# Patient Record
Sex: Male | Born: 1935 | Race: White | Hispanic: No | Marital: Married | Smoking: Never smoker
Health system: Southern US, Community
[De-identification: ages and names within clinical notes are randomized; demographics above are authoritative.]

## PROBLEM LIST (undated history)

## (undated) DIAGNOSIS — Z7901 Long term (current) use of anticoagulants: Secondary | ICD-10-CM

## (undated) DIAGNOSIS — I2510 Atherosclerotic heart disease of native coronary artery without angina pectoris: Secondary | ICD-10-CM

## (undated) DIAGNOSIS — E785 Hyperlipidemia, unspecified: Secondary | ICD-10-CM

## (undated) DIAGNOSIS — I10 Essential (primary) hypertension: Secondary | ICD-10-CM

## (undated) DIAGNOSIS — I4892 Unspecified atrial flutter: Secondary | ICD-10-CM

## (undated) HISTORY — DX: Atherosclerotic heart disease of native coronary artery without angina pectoris: I25.10

## (undated) HISTORY — DX: Unspecified atrial flutter: I48.92

## (undated) HISTORY — DX: Long term (current) use of anticoagulants: Z79.01

## (undated) HISTORY — DX: Essential (primary) hypertension: I10

## (undated) HISTORY — DX: Hyperlipidemia, unspecified: E78.5

---

## 2004-06-07 ENCOUNTER — Ambulatory Visit: Payer: Self-pay | Admitting: Internal Medicine

## 2004-06-07 ENCOUNTER — Ambulatory Visit (HOSPITAL_COMMUNITY): Admission: RE | Admit: 2004-06-07 | Discharge: 2004-06-07 | Payer: Self-pay | Admitting: Internal Medicine

## 2007-05-02 HISTORY — PX: CORONARY ARTERY BYPASS GRAFT: SHX141

## 2007-05-07 ENCOUNTER — Inpatient Hospital Stay (HOSPITAL_COMMUNITY): Admission: RE | Admit: 2007-05-07 | Discharge: 2007-05-12 | Payer: Self-pay | Admitting: Cardiovascular Disease

## 2007-05-07 ENCOUNTER — Ambulatory Visit: Payer: Self-pay | Admitting: Cardiovascular Disease

## 2007-05-07 ENCOUNTER — Encounter: Payer: Self-pay | Admitting: Thoracic Surgery (Cardiothoracic Vascular Surgery)

## 2007-05-07 ENCOUNTER — Ambulatory Visit: Payer: Self-pay | Admitting: Thoracic Surgery (Cardiothoracic Vascular Surgery)

## 2007-06-01 ENCOUNTER — Ambulatory Visit: Payer: Self-pay | Admitting: Thoracic Surgery (Cardiothoracic Vascular Surgery)

## 2007-06-01 ENCOUNTER — Encounter
Admission: RE | Admit: 2007-06-01 | Discharge: 2007-06-01 | Payer: Self-pay | Admitting: Thoracic Surgery (Cardiothoracic Vascular Surgery)

## 2007-06-03 ENCOUNTER — Ambulatory Visit: Payer: Self-pay | Admitting: Cardiovascular Disease

## 2007-06-04 ENCOUNTER — Encounter (HOSPITAL_COMMUNITY): Admission: RE | Admit: 2007-06-04 | Discharge: 2007-07-01 | Payer: Self-pay | Admitting: Cardiovascular Disease

## 2007-07-03 ENCOUNTER — Encounter (HOSPITAL_COMMUNITY): Admission: RE | Admit: 2007-07-03 | Discharge: 2007-08-02 | Payer: Self-pay | Admitting: Cardiovascular Disease

## 2007-08-03 ENCOUNTER — Encounter (HOSPITAL_COMMUNITY): Admission: RE | Admit: 2007-08-03 | Discharge: 2007-09-02 | Payer: Self-pay | Admitting: Cardiovascular Disease

## 2007-08-19 ENCOUNTER — Ambulatory Visit: Payer: Self-pay | Admitting: Cardiovascular Disease

## 2007-09-03 ENCOUNTER — Encounter (HOSPITAL_COMMUNITY): Admission: RE | Admit: 2007-09-03 | Discharge: 2007-10-03 | Payer: Self-pay | Admitting: Cardiovascular Disease

## 2008-02-25 ENCOUNTER — Ambulatory Visit: Payer: Self-pay | Admitting: Cardiology

## 2009-03-02 ENCOUNTER — Encounter: Payer: Self-pay | Admitting: Cardiovascular Disease

## 2009-05-22 ENCOUNTER — Encounter (INDEPENDENT_AMBULATORY_CARE_PROVIDER_SITE_OTHER): Payer: Self-pay | Admitting: *Deleted

## 2009-05-22 LAB — CONVERTED CEMR LAB
AST: 21 units/L
Alkaline Phosphatase: 71 units/L
BUN: 15 mg/dL
CO2: 27 meq/L
Chloride: 106 meq/L
Cholesterol: 139 mg/dL
Hgb A1c MFr Bld: 6.9 %
Potassium: 4.4 meq/L
Sodium: 142 meq/L
Triglycerides: 65 mg/dL

## 2009-06-05 ENCOUNTER — Telehealth (INDEPENDENT_AMBULATORY_CARE_PROVIDER_SITE_OTHER): Payer: Self-pay

## 2009-06-05 ENCOUNTER — Ambulatory Visit: Payer: Self-pay | Admitting: Cardiovascular Disease

## 2009-06-05 ENCOUNTER — Ambulatory Visit (HOSPITAL_COMMUNITY): Admission: RE | Admit: 2009-06-05 | Discharge: 2009-06-05 | Payer: Self-pay | Admitting: Internal Medicine

## 2009-06-05 ENCOUNTER — Encounter (INDEPENDENT_AMBULATORY_CARE_PROVIDER_SITE_OTHER): Payer: Self-pay | Admitting: Internal Medicine

## 2009-06-09 ENCOUNTER — Encounter (INDEPENDENT_AMBULATORY_CARE_PROVIDER_SITE_OTHER): Payer: Self-pay | Admitting: *Deleted

## 2009-06-13 ENCOUNTER — Ambulatory Visit: Payer: Self-pay | Admitting: Cardiology

## 2009-06-13 ENCOUNTER — Encounter: Payer: Self-pay | Admitting: Cardiology

## 2009-06-13 DIAGNOSIS — I4892 Unspecified atrial flutter: Secondary | ICD-10-CM | POA: Insufficient documentation

## 2009-06-14 ENCOUNTER — Encounter: Payer: Self-pay | Admitting: Cardiology

## 2009-06-19 ENCOUNTER — Encounter (INDEPENDENT_AMBULATORY_CARE_PROVIDER_SITE_OTHER): Payer: Self-pay | Admitting: *Deleted

## 2009-06-21 ENCOUNTER — Ambulatory Visit: Payer: Self-pay | Admitting: Cardiology

## 2009-06-21 LAB — CONVERTED CEMR LAB: POC INR: 2.2

## 2009-07-05 ENCOUNTER — Ambulatory Visit: Payer: Self-pay | Admitting: Cardiology

## 2009-07-05 LAB — CONVERTED CEMR LAB: POC INR: 1.9

## 2009-07-20 ENCOUNTER — Encounter (INDEPENDENT_AMBULATORY_CARE_PROVIDER_SITE_OTHER): Payer: Self-pay | Admitting: *Deleted

## 2009-07-26 ENCOUNTER — Ambulatory Visit: Payer: Self-pay | Admitting: Cardiology

## 2009-08-24 ENCOUNTER — Ambulatory Visit: Payer: Self-pay | Admitting: Cardiology

## 2009-08-24 LAB — CONVERTED CEMR LAB: POC INR: 1.6

## 2009-09-14 ENCOUNTER — Ambulatory Visit: Payer: Self-pay | Admitting: Cardiology

## 2009-09-14 LAB — CONVERTED CEMR LAB: POC INR: 3

## 2009-10-11 ENCOUNTER — Ambulatory Visit: Payer: Self-pay | Admitting: Cardiology

## 2009-10-11 LAB — CONVERTED CEMR LAB: POC INR: 1.8

## 2009-10-27 IMAGING — CR DG CHEST 1V PORT
1 series · 1 of 1 positions shown · non-contrast
Comparison: none

CLINICAL DATA: Chest pain

[view not recorded]
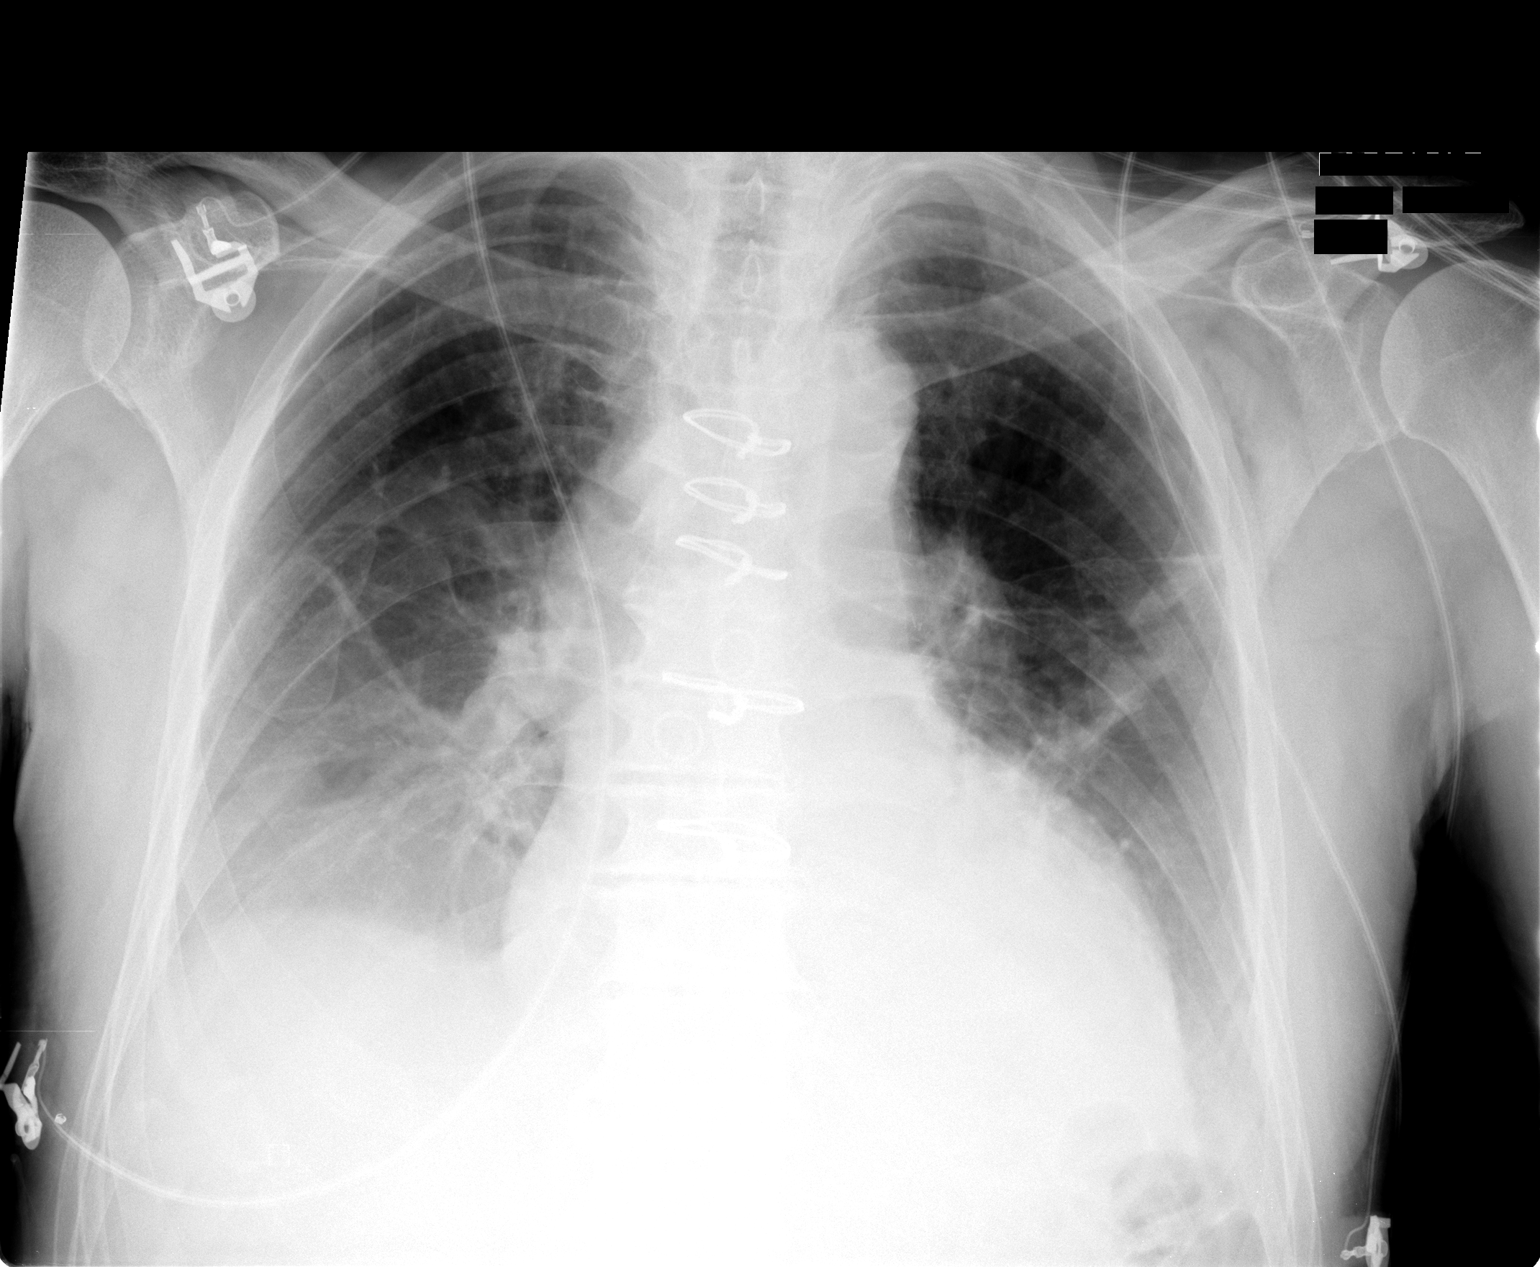

[1 of 1 positions shown; findings below may reference images not displayed]

Portable chest at [DATE]:

Comparison to previous day's portable exam. The left chest tube has been
removed, with no definite pneumothorax. Left IJ Swan-Ganz catheter and
mediastinal drains have been removed. Changes of CABG. Mild enlargement of
cardiac silhouette. Linear scarring along the left chest tube tract. Left
retrocardiac consolidation or atelectasis has increased. There is mild hazy
bibasilar atelectasis or infiltrate, possibly layering effusions.
IMPRESSION: 1. Left chest tube removal without pneumothorax.
2. Worsening left retrocardiac consolidation/atelectasis

## 2009-11-06 ENCOUNTER — Ambulatory Visit: Payer: Self-pay | Admitting: Cardiology

## 2009-12-04 ENCOUNTER — Ambulatory Visit: Payer: Self-pay | Admitting: Cardiology

## 2010-01-04 ENCOUNTER — Ambulatory Visit: Payer: Self-pay | Admitting: Cardiology

## 2010-01-08 ENCOUNTER — Ambulatory Visit: Payer: Self-pay | Admitting: Cardiology

## 2010-01-08 LAB — CONVERTED CEMR LAB: POC INR: 2.3

## 2010-02-08 ENCOUNTER — Ambulatory Visit: Payer: Self-pay | Admitting: Cardiology

## 2010-03-08 ENCOUNTER — Ambulatory Visit: Payer: Self-pay | Admitting: Cardiology

## 2010-03-08 LAB — CONVERTED CEMR LAB: POC INR: 2.3

## 2010-04-05 ENCOUNTER — Ambulatory Visit: Payer: Self-pay | Admitting: Cardiology

## 2010-04-05 LAB — CONVERTED CEMR LAB: POC INR: 2.7

## 2010-05-03 ENCOUNTER — Ambulatory Visit: Payer: Self-pay | Admitting: Cardiology

## 2010-05-31 ENCOUNTER — Ambulatory Visit: Payer: Self-pay | Admitting: Cardiology

## 2010-05-31 LAB — CONVERTED CEMR LAB: POC INR: 2.4

## 2010-06-14 ENCOUNTER — Encounter: Payer: Self-pay | Admitting: Cardiology

## 2010-06-28 ENCOUNTER — Ambulatory Visit: Payer: Self-pay | Admitting: Cardiology

## 2010-06-28 LAB — CONVERTED CEMR LAB: POC INR: 1.7

## 2010-07-26 ENCOUNTER — Ambulatory Visit: Admission: RE | Admit: 2010-07-26 | Discharge: 2010-07-26 | Payer: Self-pay | Source: Home / Self Care

## 2010-07-26 LAB — CONVERTED CEMR LAB: POC INR: 2.2

## 2010-07-31 NOTE — Medication Information (Signed)
Summary: ccr-lr  Anticoagulant Therapy  Managed by: Vashti Hey RN PCP: Osborne Casco Supervising MD: Daleen Squibb MD, Maisie Fus Indication 1: Atrial Flutter Lab Used: [facility] [place] Site: [place] INR POC 1.9  Dietary changes: no    Health status changes: no    Bleeding/hemorrhagic complications: no    Recent/future hospitalizations: no    Any changes in medication regimen? no    Recent/future dental: no  Any missed doses?: no       Is patient compliant with meds? yes       Allergies: No Known Drug Allergies  Anticoagulation Management History:      The patient is taking warfarin and comes in today for a routine follow up visit.  Positive risk factors for bleeding include an age of 75 years or older.  The bleeding index is 'intermediate risk'.  Positive CHADS2 values include Age > 75 years old.  Anticoagulation responsible provider: Daleen Squibb MD, Maisie Fus.  INR POC: 1.9.  Cuvette Lot#: [id number].    Anticoagulation Management Assessment/Plan:      The patient's current anticoagulation dose is Coumadin 5 mg tabs: take as directed.  The target INR is 2.0-3.0.  The next INR is due 07/26/2009.  Anticoagulation instructions were given to patient.  Results were reviewed/authorized by Vashti Hey RN.  He was notified by Vashti Hey RN.         Prior Anticoagulation Instructions: INR 2.2 Pt has been taking coumadin 5mg  alternating with 2.5mg  He takes 5mg  on even days and 2.5mg  on odd days Continue same dose  Current Anticoagulation Instructions: INR 1.9 Increase coumadin to 5mg  once daily except 2.5mg  on Mondays, Wednesdays and Fridays

## 2010-07-31 NOTE — Medication Information (Signed)
Summary: ccr-lr  Anticoagulant Therapy  Managed by: Vashti Hey RN PCP: Dr. Carylon Perches Supervising MD: Dietrich Pates MD, Molly Maduro Indication 1: Atrial Flutter Lab Used: [facility] [place] Site: [place] INR POC 2.0  Dietary changes: no    Health status changes: no    Bleeding/hemorrhagic complications: no    Recent/future hospitalizations: no    Any changes in medication regimen? no    Recent/future dental: no  Any missed doses?: no       Is patient compliant with meds? yes       Allergies: No Known Drug Allergies  Anticoagulation Management History:      The patient is taking warfarin and comes in today for a routine follow up visit.  Positive risk factors for bleeding include an age of 75 years or older.  The bleeding index is 'intermediate risk'.  Positive CHADS2 values include Age > 37 years old.  Anticoagulation responsible provider: Dietrich Pates MD, Molly Maduro.  INR POC: 2.0.  Cuvette Lot#: [id number].    Anticoagulation Management Assessment/Plan:      The patient's current anticoagulation dose is Coumadin 5 mg tabs: take as directed.  The target INR is 2.0-3.0.  The next INR is due 03/08/2010.  Anticoagulation instructions were given to patient.  Results were reviewed/authorized by Vashti Hey RN.  He was notified by Vashti Hey RN.         Prior Anticoagulation Instructions: INR 2.3 Continue coumadin 5mg  once daily   Current Anticoagulation Instructions: INR 2.0 Take coumadin 1 1/2 tablets tonight then resume 1 tablet once daily

## 2010-07-31 NOTE — Medication Information (Signed)
Summary: ccr  Anticoagulant Therapy  Managed by: Vashti Hey RN PCP: Dr. Carylon Perches Supervising MD: Daleen Squibb MD, Maisie Fus Indication 1: Atrial Flutter Lab Used: [facility] [place] Site: [place] INR POC 2.4  Dietary changes: no    Health status changes: no    Bleeding/hemorrhagic complications: no    Recent/future hospitalizations: no    Any changes in medication regimen? no    Recent/future dental: no  Any missed doses?: no       Is patient compliant with meds? yes       Allergies: No Known Drug Allergies  Anticoagulation Management History:      The patient is taking warfarin and comes in today for a routine follow up visit.  Positive risk factors for bleeding include an age of 75 years or older.  The bleeding index is 'intermediate risk'.  Positive CHADS2 values include Age > 44 years old.  Anticoagulation responsible provider: Daleen Squibb MD, Maisie Fus.  INR POC: 2.4.  Cuvette Lot#: [id number].    Anticoagulation Management Assessment/Plan:      The patient's current anticoagulation dose is Coumadin 5 mg tabs: take as directed.  The target INR is 2.0-3.0.  The next INR is due 06/28/2010.  Anticoagulation instructions were given to patient.  Results were reviewed/authorized by Vashti Hey RN.  He was notified by Vashti Hey RN.         Prior Anticoagulation Instructions: INR 1.9 Take coumadin 1 1/2 tablets tonight then resume 1 tablet once daily   Current Anticoagulation Instructions: INR 2.4 Continue coumadin 5mg  once daily

## 2010-07-31 NOTE — Medication Information (Signed)
Summary: ccr-lr  Anticoagulant Therapy  Managed by: Vashti Hey RN PCP: Dr. Carylon Perches Supervising MD: Dietrich Pates MD, Molly Maduro Indication 1: Atrial Flutter Lab Used: [facility] [place] Site: [place] INR POC 2.3  Dietary changes: no    Health status changes: no    Bleeding/hemorrhagic complications: no    Recent/future hospitalizations: no    Any changes in medication regimen? no    Recent/future dental: no  Any missed doses?: no       Is patient compliant with meds? yes       Allergies: No Known Drug Allergies  Anticoagulation Management History:      The patient is taking warfarin and comes in today for a routine follow up visit.  Positive risk factors for bleeding include an age of 75 years or older.  The bleeding index is 'intermediate risk'.  Positive CHADS2 values include Age > 85 years old.  Anticoagulation responsible provider: Dietrich Pates MD, Molly Maduro.  INR POC: 2.3.  Cuvette Lot#: [id number].    Anticoagulation Management Assessment/Plan:      The patient's current anticoagulation dose is Coumadin 5 mg tabs: take as directed.  The target INR is 2.0-3.0.  The next INR is due 02/08/2010.  Anticoagulation instructions were given to patient.  Results were reviewed/authorized by Vashti Hey RN.  He was notified by Vashti Hey RN.         Prior Anticoagulation Instructions: INR 3.0 Continue coumadin 5mg  once daily   Current Anticoagulation Instructions: INR 2.3 Continue coumadin 5mg  once daily

## 2010-07-31 NOTE — Medication Information (Signed)
Summary: ccr-lr  Anticoagulant Therapy  Managed by: Vashti Hey RN PCP: Genevie Cheshire MD: Dietrich Pates MD, Molly Maduro Indication 1: Atrial Flutter Lab Used: [facility] [place] Site: [place] INR POC 1.8  Dietary changes: no    Health status changes: no    Bleeding/hemorrhagic complications: no    Recent/future hospitalizations: no    Any changes in medication regimen? no    Recent/future dental: no  Any missed doses?: no       Is patient compliant with meds? yes       Allergies: No Known Drug Allergies  Anticoagulation Management History:      The patient is taking warfarin and comes in today for a routine follow up visit.  Positive risk factors for bleeding include an age of 75 years or older.  The bleeding index is 'intermediate risk'.  Positive CHADS2 values include Age > 75 years old.  Anticoagulation responsible provider: Dietrich Pates MD, Molly Maduro.  INR POC: 1.8.  Cuvette Lot#: [id number].    Anticoagulation Management Assessment/Plan:      The patient's current anticoagulation dose is Coumadin 5 mg tabs: take as directed.  The target INR is 2.0-3.0.  The next INR is due 11/06/2009.  Anticoagulation instructions were given to patient.  Results were reviewed/authorized by Vashti Hey RN.  He was notified by Vashti Hey RN.         Prior Anticoagulation Instructions: INR 3.0 Take coumadin 2.5mg  tonight then resume 5mg  once daily except 2.5mg  on Wednesdays  Current Anticoagulation Instructions: INR 1.8 Take coumadin 1 1/2 tablets tonight then increase dose to 1 tablet once daily

## 2010-07-31 NOTE — Medication Information (Signed)
Summary: ccr-lr  Anticoagulant Therapy  Managed by: Vashti Hey RN PCP: Genevie Cheshire MD: Dietrich Pates MD, Molly Maduro Indication 1: Atrial Flutter Lab Used: [facility] [place] Site: [place] INR POC 3.0  Dietary changes: no    Health status changes: no    Bleeding/hemorrhagic complications: no    Recent/future hospitalizations: no    Any changes in medication regimen? no    Recent/future dental: no  Any missed doses?: yes     Details: took coumadin 5mg  last night instead of 2.5mg   Is patient compliant with meds? yes       Allergies: No Known Drug Allergies  Anticoagulation Management History:      The patient is taking warfarin and comes in today for a routine follow up visit.  Positive risk factors for bleeding include an age of 75 years or older.  The bleeding index is 'intermediate risk'.  Positive CHADS2 values include Age > 75 years old.  Anticoagulation responsible provider: Dietrich Pates MD, Molly Maduro.  INR POC: 3.0.  Cuvette Lot#: [id number].    Anticoagulation Management Assessment/Plan:      The patient's current anticoagulation dose is Coumadin 5 mg tabs: take as directed.  The target INR is 2.0-3.0.  The next INR is due 10/11/2009.  Anticoagulation instructions were given to patient.  Results were reviewed/authorized by Vashti Hey RN.  He was notified by Vashti Hey RN.         Prior Anticoagulation Instructions: INR 1.6 Take coumadin 1 1/2 tablet tonight then increase dose to 1 tablet once daily except 1/2 tablet on WednesdaysThe patient is to stop coumadin.    Current Anticoagulation Instructions: INR 3.0 Take coumadin 2.5mg  tonight then resume 5mg  once daily except 2.5mg  on Wednesdays

## 2010-07-31 NOTE — Medication Information (Signed)
Summary: ccr-lr  Anticoagulant Therapy  Managed by: Vashti Hey RN PCP: Genevie Cheshire MD: Dietrich Pates MD, Molly Maduro Indication 1: Atrial Flutter Lab Used: [facility] [place] Site: [place] INR POC 3.0  Dietary changes: no    Health status changes: no    Bleeding/hemorrhagic complications: no    Recent/future hospitalizations: no    Any changes in medication regimen? yes       Details: started on Metformin  Recent/future dental: no  Any missed doses?: no       Is patient compliant with meds? yes       Allergies: No Known Drug Allergies  Anticoagulation Management History:      The patient is taking warfarin and comes in today for a routine follow up visit.  Positive risk factors for bleeding include an age of 75 years or older.  The bleeding index is 'intermediate risk'.  Positive CHADS2 values include Age > 62 years old.  Anticoagulation responsible provider: Dietrich Pates MD, Molly Maduro.  INR POC: 3.0.  Cuvette Lot#: [id number].    Anticoagulation Management Assessment/Plan:      The patient's current anticoagulation dose is Coumadin 5 mg tabs: take as directed.  The target INR is 2.0-3.0.  The next INR is due 01/08/2010.  Anticoagulation instructions were given to patient.  Results were reviewed/authorized by Vashti Hey RN.  He was notified by Vashti Hey RN.         Prior Anticoagulation Instructions: INR 3.2 Take coumadin 1/2 tablet tonight then resume 1 tablet once daily   Current Anticoagulation Instructions: INR 3.0 Continue coumadin 5mg  once daily

## 2010-07-31 NOTE — Medication Information (Signed)
Summary: ccr-lr  Anticoagulant Therapy  Managed by: Vashti Hey RN PCP: Dr. Carylon Perches Supervising MD: Dietrich Pates MD, Molly Maduro Indication 1: Atrial Flutter Lab Used: [facility] [place] Site: [place] INR POC 2.3  Dietary changes: no    Health status changes: no    Bleeding/hemorrhagic complications: no    Recent/future hospitalizations: no    Any changes in medication regimen? no    Recent/future dental: no  Any missed doses?: no       Is patient compliant with meds? yes       Allergies: No Known Drug Allergies  Anticoagulation Management History:      The patient is taking warfarin and comes in today for a routine follow up visit.  Positive risk factors for bleeding include an age of 66 years or older.  The bleeding index is 'intermediate risk'.  Positive CHADS2 values include Age > 61 years old.  Anticoagulation responsible provider: Dietrich Pates MD, Molly Maduro.  INR POC: 2.3.  Cuvette Lot#: [id number].    Anticoagulation Management Assessment/Plan:      The patient's current anticoagulation dose is Coumadin 5 mg tabs: take as directed.  The target INR is 2.0-3.0.  The next INR is due 04/05/2010.  Anticoagulation instructions were given to patient.  Results were reviewed/authorized by Vashti Hey RN.  He was notified by Vashti Hey RN.         Prior Anticoagulation Instructions: INR 2.0 Take coumadin 1 1/2 tablets tonight then resume 1 tablet once daily   Current Anticoagulation Instructions: INR 2.3 Continue coumadin 5mg  once daily

## 2010-07-31 NOTE — Medication Information (Signed)
Summary: ccr-lr  Anticoagulant Therapy  Managed by: Vashti Hey RN PCP: Genevie Cheshire MD: Dietrich Pates MD, Molly Maduro Indication 1: Atrial Flutter Lab Used: [facility] [place] Site: [place] INR POC 1.6  Dietary changes: no    Health status changes: no    Bleeding/hemorrhagic complications: no    Recent/future hospitalizations: no    Any changes in medication regimen? no    Recent/future dental: no  Any missed doses?: no       Is patient compliant with meds? yes       Allergies: No Known Drug Allergies  Anticoagulation Management History:      The patient is taking warfarin and comes in today for a routine follow up visit.  Positive risk factors for bleeding include an age of 75 years or older.  The bleeding index is 'intermediate risk'.  Positive CHADS2 values include Age > 75 years old.  Anticoagulation responsible provider: Dietrich Pates MD, Molly Maduro.  INR POC: 1.6.  Cuvette Lot#: [id number].    Anticoagulation Management Assessment/Plan:      The patient's current anticoagulation dose is Coumadin 5 mg tabs: take as directed.  The target INR is 2.0-3.0.  The next INR is due 09/14/2009.  Anticoagulation instructions were given to patient.  Results were reviewed/authorized by Vashti Hey RN.  He was notified by Vashti Hey RN.         Prior Anticoagulation Instructions: INR 2.0 Take coumadin 1 tablet tonight then resume 1 tablet once daily except 1/2 tablet on Mondays, Wednesdays and Fridays  Current Anticoagulation Instructions: INR 1.6 Take coumadin 1 1/2 tablet tonight then increase dose to 1 tablet once daily except 1/2 tablet on WednesdaysThe patient is to stop coumadin.

## 2010-07-31 NOTE — Medication Information (Signed)
Summary: ccr-lr  Anticoagulant Therapy  Managed by: Vashti Hey RN PCP: Dr.Roy Scharlene Corn MD: Diona Browner MD, Remi Deter Indication 1: Atrial Flutter Lab Used: [facility] [place] Site: [place] INR POC 2.0  Dietary changes: no    Health status changes: no    Bleeding/hemorrhagic complications: no    Recent/future hospitalizations: no    Any changes in medication regimen? no    Recent/future dental: no  Any missed doses?: no       Is patient compliant with meds? yes       Allergies: No Known Drug Allergies  Anticoagulation Management History:      The patient is taking warfarin and comes in today for a routine follow up visit.  Positive risk factors for bleeding include an age of 75 years or older.  The bleeding index is 'intermediate risk'.  Positive CHADS2 values include Age > 75 years old.  Anticoagulation responsible provider: Diona Browner MD, Remi Deter.  INR POC: 2.0.  Cuvette Lot#: [id number].    Anticoagulation Management Assessment/Plan:      The patient's current anticoagulation dose is Coumadin 5 mg tabs: take as directed.  The target INR is 2.0-3.0.  The next INR is due 08/24/2009.  Anticoagulation instructions were given to patient.  Results were reviewed/authorized by Vashti Hey RN.  He was notified by Vashti Hey RN.         Prior Anticoagulation Instructions: INR 1.9 Increase coumadin to 5mg  once daily except 2.5mg  on Mondays, Wednesdays and Fridays   Current Anticoagulation Instructions: INR 2.0 Take coumadin 1 tablet tonight then resume 1 tablet once daily except 1/2 tablet on Mondays, Wednesdays and Fridays

## 2010-07-31 NOTE — Medication Information (Signed)
Summary: ccr-lr  Anticoagulant Therapy  Managed by: Vashti Hey RN PCP: Dr. Carylon Perches Supervising MD: Dietrich Pates MD, Molly Maduro Indication 1: Atrial Flutter Lab Used: [facility] [place] Site: [place]  Dietary changes: no    Health status changes: no    Bleeding/hemorrhagic complications: no    Recent/future hospitalizations: no    Any changes in medication regimen? no    Recent/future dental: no  Any missed doses?: yes     Details: Missed 1 dose last Sunday  Is patient compliant with meds? yes       Allergies: No Known Drug Allergies  Anticoagulation Management History:      The patient is taking warfarin and comes in today for a routine follow up visit.  Positive risk factors for bleeding include an age of 75 years or older.  The bleeding index is 'intermediate risk'.  Positive CHADS2 values include Age > 78 years old.  Anticoagulation responsible provider: Dietrich Pates MD, Molly Maduro.  Cuvette Lot#: [id number].    Anticoagulation Management Assessment/Plan:      The patient's current anticoagulation dose is Coumadin 5 mg tabs: take as directed.  The target INR is 2.0-3.0.  The next INR is due 05/31/2010.  Anticoagulation instructions were given to patient.  Results were reviewed/authorized by Vashti Hey RN.  He was notified by Vashti Hey RN.         Prior Anticoagulation Instructions: INR 2.7 Continue coumadin 5mg  once daily   Current Anticoagulation Instructions: INR 1.9 Take coumadin 1 1/2 tablets tonight then resume 1 tablet once daily

## 2010-07-31 NOTE — Letter (Signed)
Summary: [place] [employer] [facility], agricultural at [facility]  [address], [place] [address]   Phone: [phone]  Fax: [phone]      July 20, 2009 MRN: [id number]   Jeremy White [address] [place], [place]  [address]   Dear [name],  Your test ordered by Selena Batten has been reviewed by your physician (or physician assistant) and was found to be normal or stable. Your physician (or physician assistant) felt no changes were needed at this time.  _x___ Echocardiogram  ____ Cardiac Stress Test  ____ Lab Work  ____ Peripheral vascular study of arms, legs or neck  ____ CT scan or X-ray  ____ Lung or Breathing test  ____ Other:  No change in medical treatment at this time, per Dr. Eden Emms.  Thank you, [name] RN    [place] Bing, MD, Lenise Arena.C.Gaylord Shih, MD, F.A.C.C Lewayne Bunting, MD, F.A.C.C Nona Dell, MD, F.A.C.C Charlton Haws, MD, Lenise Arena.C.C

## 2010-07-31 NOTE — Medication Information (Signed)
Summary: ccr-lr  Anticoagulant Therapy  Managed by: Vashti Hey RN PCP: Dr. Carylon Perches Supervising MD: Dietrich Pates MD, Molly Maduro Indication 1: Atrial Flutter Lab Used: [facility] [place] Site: [place] INR POC 2.7  Dietary changes: no    Health status changes: no    Bleeding/hemorrhagic complications: no    Recent/future hospitalizations: no    Any changes in medication regimen? no    Recent/future dental: no  Any missed doses?: no       Is patient compliant with meds? yes       Allergies: No Known Drug Allergies  Anticoagulation Management History:      The patient is taking warfarin and comes in today for a routine follow up visit.  Positive risk factors for bleeding include an age of 75 years or older.  The bleeding index is 'intermediate risk'.  Positive CHADS2 values include Age > 76 years old.  Anticoagulation responsible provider: Dietrich Pates MD, Molly Maduro.  INR POC: 2.7.  Cuvette Lot#: [id number].    Anticoagulation Management Assessment/Plan:      The patient's current anticoagulation dose is Coumadin 5 mg tabs: take as directed.  The target INR is 2.0-3.0.  The next INR is due 05/03/2010.  Anticoagulation instructions were given to patient.  Results were reviewed/authorized by Vashti Hey RN.  He was notified by Vashti Hey RN.         Prior Anticoagulation Instructions: INR 2.3 Continue coumadin 5mg  once daily   Current Anticoagulation Instructions: INR 2.7 Continue coumadin 5mg  once daily

## 2010-07-31 NOTE — Medication Information (Signed)
Summary: ccr-lr  Anticoagulant Therapy  Managed by: Vashti Hey RN PCP: Genevie Cheshire MD: Dietrich Pates MD, Molly Maduro Indication 1: Atrial Flutter Lab Used: [facility] [place] Site: [place] INR POC 3.2  Dietary changes: no    Health status changes: no    Bleeding/hemorrhagic complications: no    Recent/future hospitalizations: no    Any changes in medication regimen? no    Recent/future dental: no  Any missed doses?: no       Is patient compliant with meds? yes       Allergies: No Known Drug Allergies  Anticoagulation Management History:      The patient is taking warfarin and comes in today for a routine follow up visit.  Positive risk factors for bleeding include an age of 75 years or older.  The bleeding index is 'intermediate risk'.  Positive CHADS2 values include Age > 75 years old.  Anticoagulation responsible provider: Dietrich Pates MD, Molly Maduro.  INR POC: 3.2.  Cuvette Lot#: [id number].    Anticoagulation Management Assessment/Plan:      The patient's current anticoagulation dose is Coumadin 5 mg tabs: take as directed.  The target INR is 2.0-3.0.  The next INR is due 12/04/2009.  Anticoagulation instructions were given to patient.  Results were reviewed/authorized by Vashti Hey RN.  He was notified by Vashti Hey RN.         Prior Anticoagulation Instructions: INR 1.8 Take coumadin 1 1/2 tablets tonight then increase dose to 1 tablet once daily   Current Anticoagulation Instructions: INR 3.2 Take coumadin 1/2 tablet tonight then resume 1 tablet once daily

## 2010-07-31 NOTE — Assessment & Plan Note (Signed)
Summary: 6 mth f/u per checkout on 06/21/09/tg   Visit Type:  Follow-up Primary Provider:  Dr. Carylon Perches   History of Present Illness: 77 year old male presents for followup. He states that he has been feeling relatively well, without any major sense of palpitations, no unusual breathlessness, and no anginal chest pain. Before the weather got so hot, he was walking 2 miles a day.  He reports compliance with medications including Coumadin, and has had no major bleeding problems.  He states that he was diagnosed with diabetes mellitus by Dr. Ouida Sills, and has been on metformin.  Current Medications (verified): 1)  Metoprolol Succinate 25 Mg Xr24h-Tab (Metoprolol Succinate) .... Take 1 Tablet By Mouth Once A Day 2)  Simvastatin 40 Mg Tabs (Simvastatin) .... Take 1 Tablet By Mouth At Bedtime 3)  Coumadin 5 Mg Tabs (Warfarin Sodium) .... Take As Directed 4)  Ecotrin 325 Mg Tbec (Aspirin) .... Take 1/2 Tab Two Times A Day 5)  Metformin Hcl 500 Mg Tabs (Metformin Hcl) .... 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 Left ventricle: Inferobasal hypokinesis      The cavity size was mildly dilated. Wall thickness was increased      in a pattern of moderate LVH. Systolic function was normal. The      estimated ejection fraction was in the range of 50% to 55%.   2. Aortic valve: Trivial regurgitation.   3. Left atrium: The atrium was mildly dilated.   4. Right atrium: The atrium was mildly dilated.   5. Atrial septum: No defect or patent  foramen ovale was identified. (06/05/2009)    Family History: Noncontributory  Social History: Tobacco Use - No Alcohol Use - no Regular Exercise - yes Drug Use - no Widowed Nov 2010  Review of Systems  The patient denies anorexia, fever, chest pain, syncope, dyspnea on exertion, peripheral edema, melena, hematochezia, and severe indigestion/heartburn.         Otherwise reviewed and negative except as outlined.  Vital Signs:  Patient profile:   77 year old male Weight:      153 pounds Pulse rate:   81 / minute BP sitting:   126 / 75  (right arm)  Vitals Entered By: Dreama Saa, CNA (January 04, 2010 2:35 PM)  Physical Exam  Additional Exam:  Normally nourished appearing elderly male in no acute distress. HEENT: Conjunctiva and lids normal, oropharynx with moist mucosa. Neck: Supple, no elevated JVP or bruits. Lungs: Clear to auscultation, nontender. Cardiac: Irregularly irregular rhythm, no S3 gallop, soft systolic murmur. Abdomen: Soft, nontender, bowel sounds present. Extremities: No pitting edema, venous varicosities left greater than right, distal pulses one plus.   EKG  Procedure date:  01/04/2010  Findings:      Atrial flutter with 4:1 block and incomplete right bundle branch block.  Impression & Recommendations:  Problem # 1:  CORONARY ATHEROSCLEROSIS, NATIVE VESSEL (ICD-414.01)  Clinically stable without angina on present medical therapy. Followup will be arranged for 6 months.  His updated medication list for this  problem includes:    Metoprolol Succinate 25 Mg Xr24h-tab (Metoprolol succinate) .Marland Kitchen... Take 1 tablet by mouth once a day    Coumadin 5 Mg Tabs (Warfarin sodium) .Marland Kitchen... Take as directed    Ecotrin 325 Mg Tbec (Aspirin) .Marland Kitchen... Take 1/2 tab two times a day  Problem # 2:  ATRIAL FLUTTER (ICD-427.32)  Rate controlled and on Coumadin for stroke prophylaxis. Patient is not particularly symptomatic with this dysrhythmia.  His updated medication  list for this problem includes:    Metoprolol Succinate 25 Mg Xr24h-tab (Metoprolol succinate) .Marland Kitchen... Take 1 tablet by mouth once a day    Coumadin 5 Mg Tabs (Warfarin sodium) .Marland Kitchen... Take as directed    Ecotrin 325 Mg Tbec (Aspirin) .Marland Kitchen... Take 1/2 tab two times a day  Problem # 3:  HYPERLIPIDEMIA (ICD-272.4)  Followed by Dr. Ouida Sills.  His updated medication list for this problem includes:    Simvastatin 40 Mg Tabs (Simvastatin) .Marland Kitchen... Take 1 tablet by mouth at bedtime  Patient Instructions: 1)  Your physician recommends that you schedule a follow-up appointment in: 6 months 2)  Your physician recommends that you continue on your current medications as directed. Please refer to the Current Medication list given to you today.

## 2010-08-02 NOTE — Medication Information (Signed)
Summary: ccr-lr  Anticoagulant Therapy  Managed by: Vashti Hey RN PCP: Dr. Carylon Perches Supervising MD: Dietrich Pates MD, Molly Maduro Indication 1: Atrial Flutter Lab Used: [facility] [place] Site: [place] INR POC 2.2  Dietary changes: no    Health status changes: no    Bleeding/hemorrhagic complications: no    Recent/future hospitalizations: no    Any changes in medication regimen? no    Recent/future dental: no  Any missed doses?: no       Is patient compliant with meds? yes       Allergies: No Known Drug Allergies  Anticoagulation Management History:      The patient is taking warfarin and comes in today for a routine follow up visit.  Positive risk factors for bleeding include an age of 75 years or older.  The bleeding index is 'intermediate risk'.  Positive CHADS2 values include Age > 37 years old.  Anticoagulation responsible provider: Dietrich Pates MD, Molly Maduro.  INR POC: 2.2.  Cuvette Lot#: [id number].    Anticoagulation Management Assessment/Plan:      The patient's current anticoagulation dose is Coumadin 5 mg tabs: take as directed.  The target INR is 2.0-3.0.  The next INR is due 08/23/2010.  Anticoagulation instructions were given to patient.  Results were reviewed/authorized by Vashti Hey RN.  He was notified by Vashti Hey RN.         Prior Anticoagulation Instructions: INR 1.7 Take coumadin 2 tablets tonight then resume 1 tablet once daily   Current Anticoagulation Instructions: INR 2.2 Continue coumadin 5mg  once daily

## 2010-08-02 NOTE — Medication Information (Signed)
Summary: ccr-lr  Anticoagulant Therapy  Managed by: Vashti Hey RN PCP: Dr. Carylon Perches Supervising MD: Dietrich Pates MD, Molly Maduro Indication 1: Atrial Flutter Lab Used: [facility] [place] Site: [place] INR POC 1.7  Dietary changes: no    Health status changes: no    Bleeding/hemorrhagic complications: no    Recent/future hospitalizations: no    Any changes in medication regimen? no    Recent/future dental: no  Any missed doses?: no       Is patient compliant with meds? yes       Allergies: No Known Drug Allergies  Anticoagulation Management History:      The patient is taking warfarin and comes in today for a routine follow up visit.  Positive risk factors for bleeding include an age of 75 years or older.  The bleeding index is 'intermediate risk'.  Positive CHADS2 values include Age > 60 years old.  Anticoagulation responsible provider: Dietrich Pates MD, Molly Maduro.  INR POC: 1.7.  Cuvette Lot#: [id number].    Anticoagulation Management Assessment/Plan:      The patient's current anticoagulation dose is Coumadin 5 mg tabs: take as directed.  The target INR is 2.0-3.0.  The next INR is due 07/26/2010.  Anticoagulation instructions were given to patient.  Results were reviewed/authorized by Vashti Hey RN.  He was notified by Vashti Hey RN.         Prior Anticoagulation Instructions: INR 2.4 Continue coumadin 5mg  once daily   Current Anticoagulation Instructions: INR 1.7 Take coumadin 2 tablets tonight then resume 1 tablet once daily

## 2010-08-23 ENCOUNTER — Encounter: Payer: Self-pay | Admitting: Cardiology

## 2010-08-23 ENCOUNTER — Encounter (INDEPENDENT_AMBULATORY_CARE_PROVIDER_SITE_OTHER): Payer: MEDICARE

## 2010-08-23 DIAGNOSIS — Z7901 Long term (current) use of anticoagulants: Secondary | ICD-10-CM

## 2010-08-23 DIAGNOSIS — I4892 Unspecified atrial flutter: Secondary | ICD-10-CM

## 2010-08-28 NOTE — Medication Information (Signed)
Summary: ccr-lr  Anticoagulant Therapy  Managed by: Vashti Hey RN PCP: Dr. Carylon Perches Supervising MD: Dietrich Pates MD, Molly Maduro Indication 1: Atrial Flutter Lab Used: [facility] [place] Site: [place] INR POC 2.7  Dietary changes: no    Health status changes: no    Bleeding/hemorrhagic complications: no    Recent/future hospitalizations: no    Any changes in medication regimen? no    Recent/future dental: no  Any missed doses?: no       Is patient compliant with meds? yes       Allergies: No Known Drug Allergies  Anticoagulation Management History:      The patient is taking warfarin and comes in today for a routine follow up visit.  Positive risk factors for bleeding include an age of 28 years or older.  The bleeding index is 'intermediate risk'.  Positive CHADS2 values include Age > 8 years old.  Anticoagulation responsible provider: Dietrich Pates MD, Molly Maduro.  INR POC: 2.7.  Cuvette Lot#: [id number].    Anticoagulation Management Assessment/Plan:      The patient's current anticoagulation dose is Coumadin 5 mg tabs: take as directed.  The target INR is 2.0-3.0.  The next INR is due 09/20/2010.  Anticoagulation instructions were given to patient.  Results were reviewed/authorized by Vashti Hey RN.  He was notified by Vashti Hey RN.         Prior Anticoagulation Instructions: INR 2.2 Continue coumadin 5mg  once daily   Current Anticoagulation Instructions: INR 2.7 Continue coumadin 5mg  once daily

## 2010-09-15 ENCOUNTER — Encounter: Payer: Self-pay | Admitting: Cardiology

## 2010-09-15 DIAGNOSIS — I4892 Unspecified atrial flutter: Secondary | ICD-10-CM

## 2010-09-15 DIAGNOSIS — Z7901 Long term (current) use of anticoagulants: Secondary | ICD-10-CM

## 2010-09-15 HISTORY — DX: Long term (current) use of anticoagulants: Z79.01

## 2010-09-20 ENCOUNTER — Ambulatory Visit (INDEPENDENT_AMBULATORY_CARE_PROVIDER_SITE_OTHER): Payer: MEDICARE | Admitting: *Deleted

## 2010-09-20 DIAGNOSIS — I4892 Unspecified atrial flutter: Secondary | ICD-10-CM

## 2010-09-20 DIAGNOSIS — Z7901 Long term (current) use of anticoagulants: Secondary | ICD-10-CM

## 2010-09-20 LAB — POCT INR: INR: 2

## 2010-10-18 ENCOUNTER — Ambulatory Visit (INDEPENDENT_AMBULATORY_CARE_PROVIDER_SITE_OTHER): Payer: MEDICARE | Admitting: *Deleted

## 2010-10-18 DIAGNOSIS — I4892 Unspecified atrial flutter: Secondary | ICD-10-CM

## 2010-10-18 DIAGNOSIS — Z7901 Long term (current) use of anticoagulants: Secondary | ICD-10-CM

## 2010-11-13 NOTE — Assessment & Plan Note (Signed)
[facility]                       [place] [facility] OFFICE NOTE   Jeremy White, [name]                       MRN:          [id number]  DATE:08/19/2007                            DOB:          06-Mar-1925    Jeremy White returns today for follow-up.   He has had recent coronary bypass surgery for left main disease, he is  doing well.  He has had cardiac rehab in [place]. He has occasional  atypical pain in the left prepectoral area, it sounds musculoskeletal.  There is no evidence of nonunion and in general he is healed well.  He  has not had any fevers or signs of infection.   He is starting to chip and putt again and I suspect that within a few  weeks when springtime is here he will be out on the golf course with his  buddies and on the driving range.  He has been compliant with his  medications. His review of systems are remarkable for a low grade  headache. He thinks it may be related to his Toprol and Zocor.  I told  him he could stop them and see if it improves, neither one of them is .  critical for him at this time. Review of systems otherwise negative.   MEDICATIONS:  1. Aspirin a day.  2. Toprol 25 a day.  3. Zocor 40 a day.  4. Flomax 0.4 a day.  5. Centrum Silver.  6. Fish oil.   PHYSICAL EXAMINATION:  Remarkable for a healthy-appearing octogenarian  in no distress. Affect is appropriate.  Weight is 159, blood pressure 110/74, pulse 64 and regular, respiratory  rate 14, afebrile.  HEENT:  Unremarkable. Carotids are normal without bruit. No  lymphadenopathy, thyromegaly or JVP elevation.  LUNGS:  Clear with good diaphragmatic motion.  No wheezing. S1, S2 with  well-healed sternum and no rub. PMI normal.  No pain to palpation.  ABDOMEN:  Benign bowel sounds positive.  No hepatosplenomegaly, no  hepatojugular reflux, no AAA, no bruit, no tenderness.  Distal pulses  are intact with no edema. He had a previous hematoma in the saphenous  vein harvest site under the right knee area which is resolved and no  longer painful.   IMPRESSION:  1. Stable status post coronary artery bypass graft for left main      disease, good left ventricular function, continue aspirin and beta-      blocker.  2. Hypercholesteremia in the setting of coronary disease. Continue      Zocor 40 a day.  3. Prostatism. Continue Flomax 0.4 a day, good urinary stream. PSA in      6 months.  4. Headache. Follow with Dr. Ouida Sills. Consider trial of stopping Toprol      and/or Zocor to see if      any of these are contributing. He has no nuchal rigidity and no      signs of significant CNS problems. If headaches persist, consider      follow-up CT scanning.     Jeremy White. Jeremy Emms, MD, [facility]  Electronically Signed    PCN/MedQ  DD: 08/19/2007  DT: 08/20/2007  Job #: [id number]

## 2010-11-13 NOTE — Assessment & Plan Note (Signed)
OFFICE VISIT   SHAFER, SWAMY  DOB:  1924-07-21                                        June 01, 2007  CHART #:  [id number]   HISTORY OF PRESENT ILLNESS:  Mr. Ballengee returned for a routine followup  status post coronary artery bypass grafting x4 on May 08, 2007.  His  postoperative recovery has been uncomplicated.  Following hospital  discharge, he has continued to improve.  Overall, Mr. Ribaudo reports no  complaints.  He is ambulating quite well and getting around without  significant limitation.  He has minimal residual soreness and is no  longer taking any pain medication.  His appetite has been slowly  improving.  He is sleeping better at night.  His activity level is quite  good.  He has no complaints.  His medications remain unchanged from the  time of the hospital discharge.   PHYSICAL EXAMINATION:  Notable for a well-appearing male with a blood  pressure of 131/83, pulse 94, oxygen saturation 97% on room air.  Examination of the chest reveals median sternotomy incision that is  healing nicely.  The sternum is stable on palpation.  Breath sounds are  clear to auscultation and symmetrical bilaterally.  No wheezes or  rhonchi are demonstrated.  Cardiovascular exam includes regular rate and  rhythm.  No murmurs, rubs, or gallops are appreciated.  The abdomen is  soft and nontender.  The extremities are warm and well perfuse.  The  small incision from the right lower extremity endoscopic and harvest  incision are healing nicely.  There is mild right lower extremity edema.  The remainder of his physical exam is unrevealing.   DIAGNOSTIC TESTS:  Chest x-ray obtained today is reviewed.  This  demonstrates clear lung fields bilaterally with trivial pleural  effusions.  No other abnormalities are noted.   IMPRESSION:  Satisfactory progress following recent coronary artery  bypass grafting.   PLAN:  I have encouraged Mr. Langworthy to continue to  gradually increase  his physical activity as tolerated.  His only limitation at this point  remaining that he refrain from heavy lifting or strenuous use of his  arms or shoulders for another 2 months.  I have encouraged him to get  involved in the cardiac rehab program.  All of his questions have been  addressed.  In the future, Mr. Sans will call and return to see [place]  here at [facility] only should further problems  or difficulties arise.   Salvatore Decent. Cornelius Moras, M.D.  Electronically Signed   CHO/MEDQ  D:  06/01/2007  T:  06/02/2007  Job:  [id number]   cc:   Noralyn Pick. Eden Emms, MD, [facility]  Kingsley Callander. Ouida Sills, MD

## 2010-11-13 NOTE — Cardiovascular Report (Signed)
NAME:  Jeremy White, Jeremy White NO.:  [id number]   MEDICAL RECORD NO.:  [id number]          PATIENT TYPE:  INP   LOCATION:  2807                         FACILITY:  [facility]   PHYSICIAN:  Noralyn Pick. Eden Emms, MD, FACCDATE OF BIRTH:  Jun 10, 1925   DATE OF PROCEDURE:  05/07/2007  DATE OF DISCHARGE:                            CARDIAC CATHETERIZATION   CORONARY ANGIOGRAPHY:  Semi-urgent arteriography was performed.  The  patient has been having crescendo chest pain with EKG changes in the  anterolateral leads.   Cine catheterization was done with 6-French catheters from right femoral  artery.  At the end of the case we used a Star Close device with good  hemostasis.   Left main coronary artery was heavily calcified.  The ostial LAD had a  90% eccentric lesion that abutted the left main.  Proximal and mid LAD  had 80% multiple discrete stenoses.  The distal LAD was a good vessel  for bypass.   The first diagonal branch was small with a 70% ostial lesion.  The  second diagonal branch had 30% distal diffuse disease.   The circumflex coronary artery was heavily calcified.  There was a 90%  to subtotally occluded ostial lesion, primarily supplied one obtuse  marginal branch, which was also bypassable.  There appeared to be an  aneurysmal segment just after the ostial stenosis.   The right coronary artery was dominant.   The proximal vessel was heavily calcified with 40% tubular disease.  The  midvessel had 40% eccentric disease.  The distal RCA had 80% multiple  discrete lesions.  The takeoff of the PDA had a 90% tubular lesion.   RAO VENTRICULOGRAPHY:  RAO ventriculography showed anteroapical  hypokinesis with an EF in the 40-45% range.  There is no gradient across  the aortic valve and no MR.  Aortic pressure is 150/78, LV pressure is  150/16.   IMPRESSION:  The patient essentially has left main equivalent with high-  grade distal right disease.  There is no way to revascularize  this.  He  is having somewhat crescendo angina with anterior lateral T-wave  changes.  I suspect that the anteroapical wall is viable.  He is  currently not having chest pain and hemodynamically stable.  A balloon  pump was not indicated.  We will call the surgeons to see if they can  operate on him this afternoon or tomorrow morning.     Noralyn Pick. Eden Emms, MD, [facility]  Electronically Signed    PCN/MEDQ  D:  05/07/2007  T:  05/08/2007  Job:  [id number]   cc:   Kingsley Callander. Ouida Sills, MD

## 2010-11-13 NOTE — Discharge Summary (Signed)
NAME:  Jeremy White, Jeremy White NO.:  [id number]   MEDICAL RECORD NO.:  [id number]          PATIENT TYPE:  INP   LOCATION:  2032                         FACILITY:  [facility]   PHYSICIAN:  Jeremy White, M.D. DATE OF BIRTH:  15-Jun-1925   DATE OF ADMISSION:  05/07/2007  DATE OF DISCHARGE:  05/12/2007                               DISCHARGE SUMMARY   PRIMARY ADMITTING DIAGNOSIS:  Chest pain.   ADDITIONAL/DISCHARGE DIAGNOSES:  1. Left main and severe three-vessel coronary artery disease.  2. Unstable angina.  3. Hyperlipidemia.  4. Hypertension, newly diagnosed.   PROCEDURES PERFORMED:  1. Cardiac catheterization.  2. Coronary artery bypass grafting x4 (left internal mammary artery to      the distal LAD, saphenous vein graft to the circumflex marginal,      saphenous vein graft to the posterior descending and sequentially      to the right posterolateral).  3. Endoscopic vein harvest from right leg.   HISTORY:  The patient is an 77 year old male who recently presented to  his primary care physician, Dr. Ouida White, with complaints of substernal  chest pressure which has been progressive in nature and occurs mostly  with exertion.  An EKG was performed which showed some T-wave changes.  Because of these findings, Dr. Eden White was contacted and the patient was  subsequently transferred to [facility] for further evaluation and  treatment.   HOSPITAL COURSE:  [name] was admitted to [facility] on  May 07, 2007.  He was seen by [facility] and underwent  cardiac catheterization by Dr. Eden White which showed critical left main  disease and three-vessel coronary artery disease with mild to moderate  left ventricular dysfunction.  He was not felt to be a good candidate  for percutaneous intervention.  Therefore, a cardiothoracic surgery  consultation was obtained.  The patient was seen by Dr. Tressie White  and films were reviewed.  Dr. Cornelius White felt that based on his  anatomy and  his accelerating symptoms of angina that he should undergo surgical  revascularization at this time.  He explained the risks, benefits and  alternatives of the procedure to the patient and he agreed to proceed  with surgery.  He was taken to the operating room on May 08, 2007,  and underwent CABG x4 as described in detail above, performed by Dr.  Cornelius White.  He tolerated the procedure well and was transferred to the SICU  in stable condition.  He was able to be extubated shortly after surgery.  He was hemodynamically stable although he did require dobutamine and  Levophed drip initially postoperatively.  Over the course of his first  24 hours he was weaned from all pressor agents and these were  discontinued.  He remained in the unit until postoperative day #2, at  which time he was able to be transferred to the floor.  He did require a  transfusion of packed red blood cells for an acute blood loss anemia.  Overall, he has done very well postoperatively.  He has had some nausea  and anorexia.  He was  treated conservatively with laxatives, stool  softeners and prokinetic agents, and is currently symptom-free.  He is  otherwise doing well.  He is afebrile and all vital signs are stable.  He is ambulating the halls without problem with cardiac rehab phase one.  He has been weaned off supplemental oxygen and is maintaining O2  saturations of greater than 90% on room air.  His incisions are all  healing well.  He is maintaining normal sinus rhythm.  His most recent  labs show hemoglobin 9.7, hematocrit 28.5, white count 12.8, platelets  87, BUN 30, creatinine 0.95.  It is felt that since he has remained  stable and is doing well he may be discharged home on May 12, 2007.   DISCHARGE MEDICATIONS:  1. Enteric-coated aspirin 325 mg daily.  2. Zocor 40 mg q.h.s.  3. Toprol-XL 25 mg daily.  4. Tylox one to two q.4-6h. p.r.n. for pain.   DISCHARGE INSTRUCTIONS:  He is asked to  refrain from driving, heavy  lifting or strenuous activity.  He may continue ambulating daily and  using his incentive spirometer.  He may shower daily and clean his  incisions with soap and water.  He will continue his same low-fat, low-  sodium diet.   DISCHARGE FOLLOWUP:  He will need to see Dr. Eden White back in the office  in 2 weeks.  He will follow up with Dr. Cornelius White on June 01, 2007, with a  chest x-ray from [facility].  In the interim, if he experiences  problems or has questions he is asked to contact our office immediately.      Jeremy White, P.A.      Jeremy White, M.D.  Electronically Signed    GC/MEDQ  D:  05/12/2007  T:  05/13/2007  Job:  [id number]   cc:   Jeremy White. Jeremy Sills, MD  Jeremy White Jeremy Emms, MD, [facility]

## 2010-11-13 NOTE — Assessment & Plan Note (Signed)
[facility]                            CARDIOLOGY OFFICE NOTE   Jeremy White, Jeremy White                       MRN:          [id number]  DATE:06/03/2007                            DOB:          10/03/1924    Jeremy White returns today for followup. He is a wonderful patient that was  sent urgently from Dr. Alonza Smoker office to [facility] for crescendo angina  over the last year. He had left main three vessel disease. He did  beautifully with CABG despite his age of 89. There were no untoward  complications and no atrial fibrillation. He saw Dr. Cornelius Moras recently and  has small bilateral effusions. He is doing well. 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 He was told he could start driving again next  week.   In talking to Kristan, he is doing well. He is not having any significant  chest pain, PND or orthopnea. He does have a bit of pain at the  endoscopic vein harvest site on the right leg. He appears to have some  ecchymosis in the cord in the right thigh. Review of systems otherwise  negative. He has been compliant with his medications.   CURRENT MEDICATIONS:  1. Aspirin a day.  2. Toprol 25 a day.  3. Zocor 40 a day.   PHYSICAL EXAMINATION:  Remarkable for a healthy-appearing 77 -year-old  patient. Blood pressure 120/70, pulse 70 and regular. He is afebrile.  Respiratory rate is 14.  HEENT: Is unremarkable. Carotids are normal without bruits. There is no  lymphadenopathy, thyromegaly or JVP elevation.  LUNGS:  Are clear to auscultation with no evidence of effusion and no  wheezing.  There is an S1, S2. He does have a faint rub. Sternum is well-healed.  PMI normal.  ABDOMEN: Is benign. Bowel sounds positive. No AAA. No  hepatosplenomegaly. No hepatojugular reflux.  Distal pulses are intact with no edema. There is a bit of induration  around the endoscopic vein harvest site just below the medial aspect of  the knee joint. He does have some ecchymosis  going up towards his thigh,  but this seems to be improving.  NEURO: Is nonfocal. No muscular weakness.   EKG: Shows sinus rhythm with anterolateral T-wave changes.   IMPRESSION:  1. Stable. Left main three vessel disease status post bypass. Continue      aspirin and beta-blocker.  2. Hypercholesterolemia. Continue Zocor and lipid and liver profile in      six months.  3. Probable venous thrombosis just above the venous endoscopic site.      It seems to be improving. No indication for Coumadin. Continue with      warm compresses at night.  4. The patient will followup with cardiac rehab up in [place]. They      have already contacted him. I will see him back in three months in      the [place] office.     Noralyn Pick. Eden Emms, MD, [facility]  Electronically Signed    PCN/MedQ  DD: 06/03/2007  DT: 06/03/2007  Job #:  [id number]   cc:   Kingsley Callander. Ouida Sills, MD

## 2010-11-13 NOTE — Op Note (Signed)
NAME:  Jeremy White, Jeremy White NO.:  [id number]   MEDICAL RECORD NO.:  [id number]          PATIENT TYPE:  INP   LOCATION:  2304                         FACILITY:  [facility]   PHYSICIAN:  Salvatore Decent. Cornelius Moras, M.D. DATE OF BIRTH:  May 18, 1925   DATE OF PROCEDURE:  05/08/2007  DATE OF DISCHARGE:                               OPERATIVE REPORT   PREOPERATIVE DIAGNOSIS:  Left main disease with three-vessel coronary  artery disease.   POSTOPERATIVE DIAGNOSIS:  Left main disease with three-vessel coronary  artery disease.   PROCEDURE:  Median sternotomy for coronary artery bypass grafting x4  (left internal mammary artery to distal left anterior descending  coronary artery, saphenous vein graft to circumflex marginal branch,  saphenous vein graft to posterior descending coronary artery with  sequential saphenous vein graft to right posterolateral branch,  endoscopic saphenous vein harvest from right thigh and right lower leg).   SURGEON:  Dr. Purcell Nails.   ASSISTANT:  Mr. Gershon Crane.   ANESTHESIA:  General.   BRIEF CLINICAL NOTE:  The patient is an 77 year old gentleman from  [place] no previous history of coronary artery disease who presents  with symptoms of accelerating exertional angina. He was seen by Dr.  Ouida Sills and a stress test was performed demonstrating evidence of  ischemia.  The patient was promptly admitted and underwent cardiac  catheterization by Dr. Charlton Haws.  The patient was found to have  critical left main disease with three-vessel coronary artery disease and  mild to moderate left ventricular dysfunction. A full consultation note  has been dictated previously.  The patient has been counseled regarding  the indications, risks, and potential benefits of surgery. Alternative  treatment strategies have been discussed.  He desires to proceed with  surgery as described.   OPERATIVE FINDINGS:  1. Mild to moderate left ventricular dysfunction with  ejection      fraction estimated at 45%.  2. Good-quality left internal mammary artery and fair to good quality      saphenous vein conduit for grafting.  3. Fair to good quality target vessels for grafting.   DESCRIPTION OF PROCEDURE:  The patient is brought to the operating room  on the above-mentioned date and central monitoring was established by  the anesthesia service under the care and direction of Dr. Judie Petit.  Specifically, a Swan-Ganz catheter was placed through the  right internal jugular approach.  A radial arterial line was placed.  Intravenous antibiotics were administered.  Following induction with  general endotracheal anesthesia, a Foley catheter was placed.  The  patient's chest, abdomen, both groins, and both lower extremities were  prepared and draped in a sterile manner. Baseline transesophageal  echocardiogram was performed.  This demonstrates mild to moderate left  ventricular dysfunction.  There is trivial aortic insufficiency and  mitral regurgitation.   A median sternotomy incision is performed and the left internal mammary  artery is dissected from the chest wall and prepared for bypass  grafting.  The left internal mammary artery is a good-quality conduit.  Simultaneously the saphenous vein was obtained from the  patient's right  thigh and the upper portion of the right lower leg using endoscopic vein  harvest technique. The saphenous vein is slightly small-caliber and as  such fair-quality conduit for grafting.  After the saphenous vein has  been removed, the small incision in the right leg is closed in multiple  layers with running absorbable suture.  The patient is heparinized  systemically and the left internal mammary artery is transected  distally. It is noted to have excellent flow.   The pericardium is opened.  The ascending aorta is normal in appearance.  The ascending aorta and the right atrium are cannulated for  cardiopulmonary bypass.   Adequate heparinization is verified.  Cardiopulmonary bypass is begun and the surface of the heart is  inspected.  Distal sites are selected for coronary bypass grafting.  The  diagonal branches of the left anterior descending coronary artery are  all too small for grafting.  A temperature probe is placed in the left  ventricular septum and a cardioplegic catheter is placed in the  ascending aorta.   The patient is allowed to cool passively to 32 degrees systemic  temperature.  The aortic crossclamp was applied and cold blood  cardioplegia is administered initially in antegrade fashion through the  aortic root.  Iced saline slush is applied for topical hypothermia.  The  initial cardioplegic arrest and myocardial cooling are felt to be  excellent.  Repeat doses of cardioplegia are administered intermittently  throughout the cross clamp portion of the operation through the aortic  root and down the subsequently placed vein grafts to maintain left  ventricular septal temperature below 15 degrees centigrade.   The following distal coronary anastomoses were performed:  1. The posterior descending coronary artery is grafted with a      saphenous vein graft in a side-to-side fashion.  This vessel      measures 2.0 mm in diameter and is a good-quality target vessel at      the site of distal grafting.  2. The posterolateral branch off the distal right coronary artery is      grafted using a sequential saphenous vein graft off of the vein      placed in the posterior descending coronary artery.  This vessel      measures 1.8 mm in diameter and is a good-quality target vessel for      grafting.  3. The circumflex marginal branch is grafted with a saphenous vein      graft in end-to-side fashion. This vessel measured 1.3 mm in      diameter and is a good-quality target vessel for grafting.  4. The distal left anterior descending coronary artery is grafted with      a left internal mammary  artery in end-to-side fashion.  This vessel      is somewhat diffusely diseased.  At the site of distal bypass, it      measures 1.8 mm in diameter and is a fair-quality target vessel for      grafting.  A 1.5-mm probe will easily pass in both directions.   Both proximal saphenous vein anastomoses were performed directly to the  ascending aorta prior to removal of the aortic crossclamp.  The left  ventricular septal temperature rises rapidly with reperfusion of the  left internal mammary artery. The aortic crossclamp was removed after a  total crossclamp time of 68 minutes.  The heart began to beat  spontaneously without need for cardioversion.  All proximal  and distal  coronary anastomoses were inspected for hemostasis and appropriate graft  orientation.  The epicardial pacing wire is fixed to the right  ventricular free wall into the right atrial appendage.  The patient is  rewarmed to 37 degrees centigrade temperature.  The patient is weaned  from cardiopulmonary bypass without difficulty.  The pacing rhythm at  separation from bypass is normal sinus rhythm with first-degree AV  block.  AV sequential pacing is employed.  Total cardiopulmonary bypass  time for the operation is 82 minutes.  No inotropic support is required.   Follow-up transesophageal echocardiogram performed by Dr. Randa Evens after  separation from bypass demonstrates preserved left ventricular function.  No other abnormalities are noted.   The venous and arterial cannulae are removed uneventfully.  Protamine is  administered to reverse the anticoagulation.  The mediastinum and the  left chest are irrigated with saline solution containing vancomycin.  Meticulous surgical hemostasis ascertained.  The mediastinum and the  left pleural space are drained with four chest tubes exited through  separate stab incisions inferiorly.  The pericardium and soft tissues  anterior to the aorta are reapproximated loosely.  The On-Q  continuous  pain management system was utilized to facilitate postoperative pain  control.  Two 10-inch catheters supplied with the On-Q kit are tunneled  into the deep subcutaneous tissues and positioned just lateral to the  lateral border of the sternum on either side.  Each catheter is flushed  with 5 mL of 0.5% bupivacaine solution and ultimately connected to a  continuous infusion pump.  The sternum was closed with double-strength  sternal wire.  The soft tissues anterior to the sternum are closed in  multiple layers and the skin is closed with running subcuticular skin  closure.   The patient tolerated the procedure well and was transported to the  surgical intensive care unit in stable condition. There are no  intraoperative complications.  All sponge, instrument and needle counts  were verified correct at the completion of the operation.  No blood  products were administered.      Salvatore Decent. Cornelius Moras, M.D.  Electronically Signed     CHO/MEDQ  D:  05/08/2007  T:  05/09/2007  Job:  [id number]   cc:   Noralyn Pick. Eden Emms, MD, [facility]  Kingsley Callander. Ouida Sills, MD

## 2010-11-13 NOTE — Assessment & Plan Note (Signed)
[facility]                       [place] CARDIOLOGY OFFICE NOTE   Jeremy White, Jeremy White                       MRN:          [id number]  DATE:02/25/2008                            DOB:          [birth year]    PRIMARY CARE PHYSICIAN:  Kingsley Callander. Ouida Sills, MD   REASON FOR VISIT:  Routine followup.   HISTORY OF PRESENT ILLNESS:  This is my first meeting with Jeremy White.  He is a very pleasant gentleman followed most recently by Dr. Eden Emms  with a history of left main/multivessel cardiovascular disease with  preserved ejection fraction diagnosed at cardiac catheterization back in  November 2008.  He underwent coronary artery bypass grafting at that  time with a LIMA to the distal left anterior descending, saphenous vein  graft to the circumflex, saphenous vein graft to the posterior  descending and posterolateral branches of the right coronary artery.  He  has done well since then and was last seen in the office back in  February.  He is not reporting any problems with angina.  He does have  atypical/musculoskeletal left-sided upper chest pain which is fairly  mild and does not limit him.  He has been tolerating his medicines  fairly well including Zocor and is due for followup liver function and  lipids.   ALLERGIES:  No known drug allergies.   PRESENT MEDICATIONS:  1. Aspirin 325 mg p.o. daily.  2. Toprol-XL 25 mg p.o. daily.  3. Zocor 40 mg p.o. at bedtime.  4. Centrum Silver 1 p.o. daily.  5. Omega-3 supplements 1000 mg p.o. daily.   REVIEW OF SYSTEMS:  As outlined above.  Occasionally, he has a mild  throbbing headache, posteriorly, specifically when he leans his head  back.  He states that he injured his left shoulder following off of a  riding lawn mower in the past and wonders if he has any arthritic  cervical pain.  He has had no visual changes.  Otherwise, systems  negative.   PHYSICAL EXAMINATION:  VITAL SIGNS:  Blood pressure is 110/70,  heart  rate of 68, weight is 155 pounds.  GENERAL:  Comfortable, normally nourished elderly male, in no acute  distress.  HEENT:  Conjunctivae normal.  Oropharynx is clear.  NECK:  Supple.  No elevated jugular venous pressure.  No loud carotid  bruits.  No thyromegaly.  LUNGS:  Clear without labored breathing.  CARDIAC:  Regular rate and rhythm.  Soft systolic murmur with preserved  S2.  No S3 gallop or pericardial rub.  ABDOMEN:  Soft and nontender.  Normoactive bowel sounds.  EXTREMITIES:  Exhibit no significant pitting edema.  Distal pulses are  2+.  SKIN:  Warm and dry.  MUSCULOSKELETAL:  No kyphosis noted.  NEUROPSYCHIATRIC:  The patient is alert and oriented x3.  Affect is  appropriate.   IMPRESSION AND RECOMMENDATIONS:  1. Left main/multivessel cardiovascular disease with preserved      ejection fraction, status post coronary artery bypass grafting in      November 2008 as outlined above.  Jeremy White is doing very well at  this time.  He is playing golf 3 times a week, and otherwise,      walking on the remaining days.  He will continue medical therapy      and we will plan to see him back over the next 6 months.  2. Hyperlipidemia, tolerating Zocor.  We will plan a follow up liver      function and lipid profile testing.  3. Recurrent headaches, posterior, and described as a throbbing.  He      does not have any carotid bruits on examination.  The description      is almost more suggestive of cervical arthritic pain.  He does      state that he injured his left shoulder before in this area.  I      have asked him to discuss it further with Dr. Ouida Sills when he sees      him for routine followup this year.     Jonelle Sidle, MD  Electronically Signed    SGM/MedQ  DD: 02/25/2008  DT: 02/26/2008  Job #: [id number]   cc:   Kingsley Callander. Ouida Sills, MD

## 2010-11-13 NOTE — H&P (Signed)
NAME:  Jeremy White, Jeremy White NO.:  [id number]   MEDICAL RECORD NO.:  [id number]          PATIENT TYPE:  OIB   LOCATION:  2899                         FACILITY:  [facility]   PHYSICIAN:  Noralyn Pick. Eden Emms, MD, FACCDATE OF BIRTH:  February 17, 1925   DATE OF ADMISSION:  05/07/2007  DATE OF DISCHARGE:                              HISTORY & PHYSICAL   PRIMARY CARE Elverna Caffee:  Dr. Carylon Perches in [place], [place] at [place].   PRIMARY CARDIOLOGIST:  Dr. Charlton Haws (new).   PATIENT PROFILE:  An 77 year old married Caucasian male without prior  history of CAD who presents with a one-year history of progressive  unstable angina, abnormal ECG.   PROBLEM LIST:  1. Unstable angina.  2. Hyperlipidemia.   HISTORY OF PRESENT ILLNESS:  An 77 year old married Caucasian male  without prior history of CAD.  He has at least a one-year history of  exertional substernal chest pressure which has been progressive in  nature and now occurs approximately four times per week, mostly when  playing golf, but may occur with any increase in activity or exertion.  Symptoms are associated with shortness of breath and lasted less than 5  minutes and resolved with rest.  He saw Dr. Ouida Sills today for a routine  follow-up and mentioned his progressive angina and an ECG was performed  and showed T-wave inversion in leads I, AVL, V1-V6.  Dr. Eden Emms was  contacted and arrangements were made for transfer to the [facility]  Lab for evaluation and catheterization.  Patient is currently pain-free.   ALLERGIES:  No known drug allergies.   HOME MEDICATION:  Zocor 10 mg daily, aspirin 325 mg daily.   FAMILY HISTORY:  Mother died at 48 of unknown cause.  Father died of an  MI at age 46.  He has no siblings.   SOCIAL HISTORY:  He lives in [place], [place], by himself.  He  is retired.  He is, in fact married, however, his wife has Alzheimer's  and lives in a nursing home.  He denies any tobacco or drug use and  will  very rarely have an alcoholic beverage.  He plays golf at least three to  four times per week, riding the cart rather than walking.  Outside of  golf, he is not routinely exercising.   REVIEW OF SYSTEMS:  Positive for chest pain, shortness breath, dyspnea  on exertion while playing golf.  Otherwise all systems reviewed and  negative.   PHYSICAL EXAM:  VITAL SIGNS:  Heart rate 78, respirations 16, blood  pressure 161/95, pulse ox 98% room air.  GENERAL APPEARANCE:  Pleasant white male in no acute distress.  Awake,  alert and oriented x3.  NECK:  No bruits or JVD.  LUNGS:  Respirations regular and unlabored.  CARDIAC:  Regular S1 and S2, no S3, S4, or murmurs.  ABDOMEN:  Round, soft, nontender, nondistended.  Bowel sounds present  x4.  EXTREMITIES:  Warm, dry and pink.  No clubbing, cyanosis or edema.  Dorsalis pedis and posterior tibial pulses 2+ and equal bilaterally.  No  femoral bruits noted.  EKG shows sinus rhythm with Q-wave inversion in V1 through V6 as well as  one in AVL.   LABORATORY DATA:  Sodium 142, potassium 4.7, chloride 105, CO2 25, BUN  24, creatinine 0.86, glucose 133.  Total bilirubin 0.8, alkaline  phosphatase 68, AST 19, ALT 13, total protein 6.7, albumin 3.9, calcium  9.1.  Total cholesterol 181, triglycerides 77, HDL 62, LDL 104.  All lab  work was from outpatient study performed on May 04, 2007.   ASSESSMENT/PLAN:  1. Unstable angina.  The patient has a one-year history of exertional      substernal chest pressure associated with dyspnea that has been      progressive and occurring now up to four times per week and is      resolved with rest.  He has an abnormal ECG with anterolateral T-      wave inversion.  Will plan to admit and cycle cardiac markers along      with cardiac catheterization today.  Will add beta blocker and      increase his Statin to Zocor 40 mg daily with an LDL of 104.      Continue aspirin.  2. Hypertension.  He has no  prior diagnosis of hypertension.  His      blood pressure is elevated currently.  We plan to add beta blocker      as above.  Continue to follow blood pressure and titrate as      necessary.  3. Hyperlipidemia.  LDL was 104 with normal LFTs on May 04, 2007.      Increase Zocor to 40 mg.      Nicolasa Ducking, ANP      Noralyn Pick. Eden Emms, MD, [facility]  Electronically Signed    CB/MEDQ  D:  05/07/2007  T:  05/07/2007  Job:  [id number]

## 2010-11-13 NOTE — Consult Note (Signed)
NAME:  DYLON, CORREA NO.:  [id number]   MEDICAL RECORD NO.:  [id number]          PATIENT TYPE:  INP   LOCATION:  2910                         FACILITY:  [facility]   PHYSICIAN:  Salvatore Decent. Cornelius Moras, M.D. DATE OF BIRTH:  [birth year]   DATE OF CONSULTATION:  05/07/2007  DATE OF DISCHARGE:                                 CONSULTATION   REQUESTING PHYSICIAN:  Dr. Charlton Haws   PRIMARY CARE PHYSICIAN:  Dr. Carylon Perches   REASON FOR CONSULTATION:  Left main disease with three-vessel coronary  artery disease and crescendo pattern angina pectoris.   HISTORY OF PRESENT ILLNESS:  Mr. Handshoe is an 77 year old retired white  male from [place] with no previous history of coronary artery disease  and risk factors notable only for history of hyperlipidemia.  The  patient's parents did have coronary artery disease, although they did  not develop heart trouble until there were in their 1s.  The patient  states that approximately 1 year ago he first developed episodes of  exertional substernal chest tightness.  These symptoms have progressed  recently, prompting him to see Dr. Ouida Sills.  He was promptly referred for  a stress test which showed new anterolateral wall T-wave changes.  He  was promptly sent for cardiac catheterization by Dr. Charlton Haws.  The  patient was found to have 90% left main coronary artery stenosis with  severe three-vessel coronary artery disease and moderate left  ventricular dysfunction.  Cardiac surgical consultation was requested.   REVIEW OF SYSTEMS:  GENERAL:  The patient reports normal appetite.  He  has not been gaining or losing weight recently.  CARDIAC:  Notable for  crescendo pattern symptoms of exertional chest pain and shortness of  breath.  The patient denies episodes of resting chest pain, nocturnal  angina, PND, orthopnea, lower extremity edema.  RESPIRATORY:  Negative.  The patient denies productive cough, hemoptysis, wheezing.  GASTROINTESTINAL:  Negative.  The patient has been treated the past for  diverticulosis and has some history of external hemorrhoids, but they  are not active presently.  NEUROLOGIC:  Negative.  The patient denies  symptoms suggestive of previous TIA or stroke.  GENITOURINARY:  Negative.  INFECTIOUS:  Negative.  HEENT:  Negative.   PAST MEDICAL HISTORY:  Hyperlipidemia.   PAST SURGICAL HISTORY:  None.   FAMILY HISTORY:  Noncontributory.   SOCIAL HISTORY:  The patient is married and lives with his wife in  [place].  His wife has recently been admitted to assisted living due  to Alzheimer's to the patient is now for the most part alone.  The  patient does have children who are nearby and supportive.  He remains  active physically and enjoys playing golf.  He is a nonsmoker and denies  alcohol consumption.   MEDICATIONS PRIOR TO ADMISSION:  1. Zocor 10 mg daily.  2. Aspirin 325 mg daily.   DRUG ALLERGIES:  None known.   PHYSICAL EXAMINATION:  The patient is a well-appearing male who appears  somewhat younger than stated age in no acute distress.  Blood pressure  140/80, pulse  80, two-channel telemetry rhythm strip demonstrates normal  sinus rhythm.  HEENT EXAM:  Unrevealing.  NECK:  Supple.  There are no carotid bruits.  There is no palpable  cervical or supraclavicular lymphadenopathy.  There is no jugular venous  distention.  CHEST:  Auscultation of the chest reveals clear breath sounds which are  symmetrical bilaterally.  No wheezes or rhonchi are noted.  CARDIOVASCULAR EXAM:  Notable for regular rate and rhythm.  No murmurs,  rubs or gallops are noted.  ABDOMEN:  Soft, nontender.  There are no palpable masses.  Bowel sounds  are present.  EXTREMITIES:  Warm and well-perfused.  There is no lower extremity  edema.  Distal pulses are palpable in the posterior tibial position.  There is no sign of significant venous insufficiency.  RECTAL AND GU EXAMS:  Both deferred.   NEUROLOGIC EXAMINATION:  Grossly nonfocal.   DIAGNOSTIC TESTS:  Cardiac catheterization performed by Dr. Eden Emms today  is reviewed.  This demonstrates heavily calcified proximal coronary  artery vessels with 90% stenosis of the distal left main coronary  artery.  There is hazy 90% proximal stenosis of the left circumflex  coronary artery with 70% stenosis of the proximal left anterior  descending coronary artery and 80% stenosis of mid left anterior  descending coronary artery after a second diagonal branch.  There is 70-  80% ostial stenosis of the first diagonal branch.  There is 50% proximal  stenosis of the right coronary artery with 70% stenosis of the distal  right coronary artery and 90% ostial stenosis of the posterior  descending coronary artery.  There is moderate left ventricular  dysfunction with ejection fraction estimated 35-40% with severe  anteroapical akinesis.   IMPRESSION:  Severe three-vessel coronary artery disease with moderate  left ventricular dysfunction and progressive symptoms of angina  pectoris.  Mr. Mccadden has critical left main disease with coronary  anatomy unfavorable for percutaneous coronary intervention.  I believe  he would benefit from surgical revascularization.  His prognosis with  medical treatment would likely not be very good due to the severity of  his left main disease.   PLAN:  I have discussed options at length with Mr. Kazlauskas.  Alternative  treatment strategies have been reviewed.  He understands and accepts all  potential associated risks of surgery including but not limited to risk  of death, stroke, myocardial infarction, congestive heart failure,  respiratory failure, pneumonia, bleeding requiring blood transfusion,  arrhythmia, infection, and recurrent coronary artery disease.  All of  his questions have been addressed.  We plan to proceed with surgery  first case tomorrow morning.      Salvatore Decent. Cornelius Moras, M.D.  Electronically  Signed     CHO/MEDQ  D:  05/07/2007  T:  05/08/2007  Job:  [id number]   cc:   Noralyn Pick. Eden Emms, MD, [facility]  Kingsley Callander. Ouida Sills, MD

## 2010-11-15 ENCOUNTER — Ambulatory Visit (INDEPENDENT_AMBULATORY_CARE_PROVIDER_SITE_OTHER): Payer: MEDICARE | Admitting: *Deleted

## 2010-11-15 DIAGNOSIS — Z7901 Long term (current) use of anticoagulants: Secondary | ICD-10-CM

## 2010-11-15 DIAGNOSIS — I4892 Unspecified atrial flutter: Secondary | ICD-10-CM

## 2010-11-15 LAB — POCT INR: INR: 2.4

## 2010-12-17 ENCOUNTER — Ambulatory Visit (INDEPENDENT_AMBULATORY_CARE_PROVIDER_SITE_OTHER): Payer: Medicare Other | Admitting: *Deleted

## 2010-12-17 DIAGNOSIS — I4892 Unspecified atrial flutter: Secondary | ICD-10-CM

## 2010-12-17 DIAGNOSIS — Z7901 Long term (current) use of anticoagulants: Secondary | ICD-10-CM

## 2011-01-14 ENCOUNTER — Ambulatory Visit (INDEPENDENT_AMBULATORY_CARE_PROVIDER_SITE_OTHER): Payer: Medicare Other | Admitting: *Deleted

## 2011-01-14 DIAGNOSIS — Z7901 Long term (current) use of anticoagulants: Secondary | ICD-10-CM

## 2011-01-14 DIAGNOSIS — I4892 Unspecified atrial flutter: Secondary | ICD-10-CM

## 2011-02-11 ENCOUNTER — Ambulatory Visit (INDEPENDENT_AMBULATORY_CARE_PROVIDER_SITE_OTHER): Payer: Medicare Other | Admitting: *Deleted

## 2011-02-11 DIAGNOSIS — I4892 Unspecified atrial flutter: Secondary | ICD-10-CM

## 2011-02-11 DIAGNOSIS — Z7901 Long term (current) use of anticoagulants: Secondary | ICD-10-CM

## 2011-03-11 ENCOUNTER — Ambulatory Visit (INDEPENDENT_AMBULATORY_CARE_PROVIDER_SITE_OTHER): Payer: Medicare Other | Admitting: *Deleted

## 2011-03-11 DIAGNOSIS — Z7901 Long term (current) use of anticoagulants: Secondary | ICD-10-CM

## 2011-03-11 DIAGNOSIS — I4892 Unspecified atrial flutter: Secondary | ICD-10-CM

## 2011-03-11 LAB — POCT INR: INR: 2.3

## 2011-04-08 ENCOUNTER — Ambulatory Visit (INDEPENDENT_AMBULATORY_CARE_PROVIDER_SITE_OTHER): Payer: Medicare Other | Admitting: *Deleted

## 2011-04-08 DIAGNOSIS — Z7901 Long term (current) use of anticoagulants: Secondary | ICD-10-CM

## 2011-04-08 DIAGNOSIS — I4892 Unspecified atrial flutter: Secondary | ICD-10-CM

## 2011-04-08 LAB — POCT INR: INR: 2.4

## 2011-04-09 LAB — APTT
aPTT: 28
aPTT: 39 — ABNORMAL HIGH

## 2011-04-09 LAB — CBC
HCT: 21.8 — ABNORMAL LOW
HCT: 28.5 — ABNORMAL LOW
HCT: 29.4 — ABNORMAL LOW
HCT: 41.9
Hemoglobin: 10.4 — ABNORMAL LOW
Hemoglobin: 14
Hemoglobin: 7.5 — CL
Hemoglobin: 9.3 — ABNORMAL LOW
MCHC: 34
MCHC: 34.5
MCV: 91.6
MCV: 91.8
MCV: 92.3
MCV: 93.1
Platelets: 159
Platelets: 87 — ABNORMAL LOW
Platelets: 87 — ABNORMAL LOW
Platelets: 91 — ABNORMAL LOW
RBC: 2.38 — ABNORMAL LOW
RBC: 2.98 — ABNORMAL LOW
RBC: 3.28 — ABNORMAL LOW
RBC: 4.52
RDW: 12.7
RDW: 13
RDW: 13.4
RDW: 13.4
WBC: 7.2
WBC: 8.4

## 2011-04-09 LAB — POCT I-STAT 3, ART BLOOD GAS (G3+)
Bicarbonate: 21
Bicarbonate: 23.2
Bicarbonate: 28 — ABNORMAL HIGH
O2 Saturation: 100
Operator id: 252761
Operator id: 285121
Patient temperature: 34.8
Patient temperature: 37.5
TCO2: 22
TCO2: 26
TCO2: 30
pCO2 arterial: 27.6 — ABNORMAL LOW
pCO2 arterial: 38.2
pCO2 arterial: 57.9
pH, Arterial: 7.292 — ABNORMAL LOW
pH, Arterial: 7.43
pH, Arterial: 7.524 — ABNORMAL HIGH
pO2, Arterial: 134 — ABNORMAL HIGH
pO2, Arterial: 139 — ABNORMAL HIGH
pO2, Arterial: 216 — ABNORMAL HIGH
pO2, Arterial: 346 — ABNORMAL HIGH

## 2011-04-09 LAB — COMPREHENSIVE METABOLIC PANEL
AST: 28
Albumin: 3.3 — ABNORMAL LOW
Alkaline Phosphatase: 68
Chloride: 105
GFR calc Af Amer: >60
Potassium: 3.7
Total Bilirubin: 0.6

## 2011-04-09 LAB — I-STAT EC8
Acid-base deficit: 5 — ABNORMAL HIGH
Chloride: 107
HCT: 22 — ABNORMAL LOW
Operator id: 285121
Potassium: 3.8
Sodium: 143
TCO2: 21
pH, Arterial: 7.338 — ABNORMAL LOW

## 2011-04-09 LAB — BASIC METABOLIC PANEL
BUN: 30 — ABNORMAL HIGH
CO2: 24
Chloride: 106
Chloride: 109
Creatinine, Ser: 0.81
GFR calc Af Amer: >60
Glucose, Bld: 116 — ABNORMAL HIGH
Glucose, Bld: 176 — ABNORMAL HIGH
Potassium: 4.1

## 2011-04-09 LAB — BLOOD GAS, ARTERIAL
Acid-Base Excess: 2.7 — ABNORMAL HIGH
Bicarbonate: 26.6 — ABNORMAL HIGH
Drawn by: 276051
FIO2: 0.21
O2 Saturation: 94.5
O2 Saturation: 95.4
Patient temperature: 37
Patient temperature: 98.6
TCO2: 27.8
pH, Arterial: 7.428
pO2, Arterial: 71.8 — ABNORMAL LOW

## 2011-04-09 LAB — PROTIME-INR
INR: 0.9
INR: 1
INR: 1.4
Prothrombin Time: 17.8 — ABNORMAL HIGH

## 2011-04-09 LAB — CREATININE, SERUM
GFR calc Af Amer: >60
GFR calc non Af Amer: >60

## 2011-04-09 LAB — ABO/RH: ABO/RH(D): O NEG

## 2011-04-09 LAB — POCT I-STAT 4, (NA,K, GLUC, HGB,HCT)
Glucose, Bld: 102 — ABNORMAL HIGH
Glucose, Bld: 128 — ABNORMAL HIGH
Glucose, Bld: 135 — ABNORMAL HIGH
HCT: 21 — ABNORMAL LOW
HCT: 36 — ABNORMAL LOW
Hemoglobin: 12.6 — ABNORMAL LOW
Hemoglobin: 7.1 — CL
Hemoglobin: 8.8 — ABNORMAL LOW
Operator id: 3291
Operator id: 3291
Operator id: 3291
Potassium: 3.5
Potassium: 3.8
Sodium: 133 — ABNORMAL LOW
Sodium: 139
Sodium: 140

## 2011-04-09 LAB — POCT I-STAT 3, VENOUS BLOOD GAS (G3P V)
Bicarbonate: 27.8 — ABNORMAL HIGH
O2 Saturation: 89
TCO2: 30
pO2, Ven: 68 — ABNORMAL HIGH

## 2011-04-09 LAB — MAGNESIUM
Magnesium: 2.5
Magnesium: 2.6 — ABNORMAL HIGH

## 2011-04-09 LAB — TYPE AND SCREEN

## 2011-04-09 LAB — TROPONIN I: Troponin I: 0.61

## 2011-04-09 LAB — URINALYSIS, ROUTINE W REFLEX MICROSCOPIC
Bilirubin Urine: NEGATIVE
Hgb urine dipstick: NEGATIVE
Protein, ur: NEGATIVE
Urobilinogen, UA: 0.2

## 2011-04-09 LAB — CK TOTAL AND CKMB (NOT AT ARMC)
CK, MB: 10 — ABNORMAL HIGH
Relative Index: 6.8 — ABNORMAL HIGH

## 2011-04-09 LAB — CARDIAC PANEL(CRET KIN+CKTOT+MB+TROPI)
Relative Index: 6 — ABNORMAL HIGH
Total CK: 100
Troponin I: 0.52

## 2011-04-09 LAB — HEMOGLOBIN AND HEMATOCRIT, BLOOD
HCT: 23.8 — ABNORMAL LOW
Hemoglobin: 8.1 — ABNORMAL LOW

## 2011-04-09 LAB — POCT I-STAT CREATININE: Operator id: 233621

## 2011-04-09 LAB — PLATELET COUNT: Platelets: 120 — ABNORMAL LOW

## 2011-05-09 ENCOUNTER — Ambulatory Visit (INDEPENDENT_AMBULATORY_CARE_PROVIDER_SITE_OTHER): Payer: Medicare Other | Admitting: *Deleted

## 2011-05-09 DIAGNOSIS — Z7901 Long term (current) use of anticoagulants: Secondary | ICD-10-CM

## 2011-05-09 DIAGNOSIS — I4892 Unspecified atrial flutter: Secondary | ICD-10-CM

## 2011-06-20 ENCOUNTER — Ambulatory Visit (INDEPENDENT_AMBULATORY_CARE_PROVIDER_SITE_OTHER): Payer: Medicare Other | Admitting: *Deleted

## 2011-06-20 DIAGNOSIS — I4892 Unspecified atrial flutter: Secondary | ICD-10-CM

## 2011-06-20 DIAGNOSIS — Z7901 Long term (current) use of anticoagulants: Secondary | ICD-10-CM

## 2011-07-18 ENCOUNTER — Ambulatory Visit (INDEPENDENT_AMBULATORY_CARE_PROVIDER_SITE_OTHER): Payer: Medicare Other | Admitting: *Deleted

## 2011-07-18 DIAGNOSIS — I4892 Unspecified atrial flutter: Secondary | ICD-10-CM

## 2011-07-18 DIAGNOSIS — Z7901 Long term (current) use of anticoagulants: Secondary | ICD-10-CM

## 2011-08-15 ENCOUNTER — Ambulatory Visit (INDEPENDENT_AMBULATORY_CARE_PROVIDER_SITE_OTHER): Payer: Medicare Other | Admitting: *Deleted

## 2011-08-15 DIAGNOSIS — I4892 Unspecified atrial flutter: Secondary | ICD-10-CM

## 2011-08-15 DIAGNOSIS — Z7901 Long term (current) use of anticoagulants: Secondary | ICD-10-CM

## 2011-08-15 LAB — POCT INR: INR: 2.5

## 2011-09-19 ENCOUNTER — Ambulatory Visit (INDEPENDENT_AMBULATORY_CARE_PROVIDER_SITE_OTHER): Payer: Medicare Other | Admitting: *Deleted

## 2011-09-19 DIAGNOSIS — Z7901 Long term (current) use of anticoagulants: Secondary | ICD-10-CM

## 2011-09-19 DIAGNOSIS — I4892 Unspecified atrial flutter: Secondary | ICD-10-CM

## 2011-10-17 ENCOUNTER — Ambulatory Visit (INDEPENDENT_AMBULATORY_CARE_PROVIDER_SITE_OTHER): Payer: Medicare Other | Admitting: *Deleted

## 2011-10-17 DIAGNOSIS — I4892 Unspecified atrial flutter: Secondary | ICD-10-CM

## 2011-10-17 DIAGNOSIS — Z7901 Long term (current) use of anticoagulants: Secondary | ICD-10-CM

## 2011-11-14 ENCOUNTER — Ambulatory Visit (INDEPENDENT_AMBULATORY_CARE_PROVIDER_SITE_OTHER): Payer: Medicare Other | Admitting: *Deleted

## 2011-11-14 DIAGNOSIS — Z7901 Long term (current) use of anticoagulants: Secondary | ICD-10-CM

## 2011-11-14 DIAGNOSIS — I4892 Unspecified atrial flutter: Secondary | ICD-10-CM

## 2011-11-14 LAB — POCT INR: INR: 3

## 2011-12-26 ENCOUNTER — Ambulatory Visit (INDEPENDENT_AMBULATORY_CARE_PROVIDER_SITE_OTHER): Payer: Medicare Other | Admitting: *Deleted

## 2011-12-26 DIAGNOSIS — I4892 Unspecified atrial flutter: Secondary | ICD-10-CM

## 2011-12-26 DIAGNOSIS — Z7901 Long term (current) use of anticoagulants: Secondary | ICD-10-CM

## 2011-12-26 LAB — POCT INR: INR: 3

## 2012-02-06 ENCOUNTER — Ambulatory Visit (INDEPENDENT_AMBULATORY_CARE_PROVIDER_SITE_OTHER): Payer: Medicare Other | Admitting: *Deleted

## 2012-02-06 DIAGNOSIS — Z7901 Long term (current) use of anticoagulants: Secondary | ICD-10-CM

## 2012-02-06 DIAGNOSIS — I4892 Unspecified atrial flutter: Secondary | ICD-10-CM

## 2012-02-06 LAB — POCT INR: INR: 2.7

## 2012-03-19 ENCOUNTER — Ambulatory Visit (INDEPENDENT_AMBULATORY_CARE_PROVIDER_SITE_OTHER): Payer: Medicare Other | Admitting: *Deleted

## 2012-03-19 DIAGNOSIS — I4892 Unspecified atrial flutter: Secondary | ICD-10-CM

## 2012-03-19 DIAGNOSIS — Z7901 Long term (current) use of anticoagulants: Secondary | ICD-10-CM

## 2012-03-19 LAB — POCT INR: INR: 2.5

## 2012-04-30 ENCOUNTER — Ambulatory Visit (INDEPENDENT_AMBULATORY_CARE_PROVIDER_SITE_OTHER): Payer: Medicare Other | Admitting: *Deleted

## 2012-04-30 DIAGNOSIS — I4892 Unspecified atrial flutter: Secondary | ICD-10-CM

## 2012-04-30 DIAGNOSIS — Z7901 Long term (current) use of anticoagulants: Secondary | ICD-10-CM

## 2012-06-11 ENCOUNTER — Ambulatory Visit (INDEPENDENT_AMBULATORY_CARE_PROVIDER_SITE_OTHER): Payer: Medicare Other | Admitting: *Deleted

## 2012-06-11 DIAGNOSIS — Z7901 Long term (current) use of anticoagulants: Secondary | ICD-10-CM

## 2012-06-11 DIAGNOSIS — I4892 Unspecified atrial flutter: Secondary | ICD-10-CM

## 2012-06-11 LAB — POCT INR: INR: 2.6

## 2012-07-23 ENCOUNTER — Ambulatory Visit (INDEPENDENT_AMBULATORY_CARE_PROVIDER_SITE_OTHER): Payer: Medicare Other | Admitting: *Deleted

## 2012-07-23 DIAGNOSIS — I4892 Unspecified atrial flutter: Secondary | ICD-10-CM

## 2012-07-23 DIAGNOSIS — Z7901 Long term (current) use of anticoagulants: Secondary | ICD-10-CM

## 2012-07-23 LAB — POCT INR: INR: 2.7

## 2012-08-31 ENCOUNTER — Ambulatory Visit: Payer: Medicare Other | Admitting: Cardiology

## 2012-08-31 ENCOUNTER — Encounter: Payer: Self-pay | Admitting: Cardiology

## 2012-08-31 DIAGNOSIS — I10 Essential (primary) hypertension: Secondary | ICD-10-CM | POA: Insufficient documentation

## 2012-08-31 DIAGNOSIS — E785 Hyperlipidemia, unspecified: Secondary | ICD-10-CM | POA: Insufficient documentation

## 2012-08-31 DIAGNOSIS — I2510 Atherosclerotic heart disease of native coronary artery without angina pectoris: Secondary | ICD-10-CM | POA: Insufficient documentation

## 2012-09-02 ENCOUNTER — Ambulatory Visit (INDEPENDENT_AMBULATORY_CARE_PROVIDER_SITE_OTHER): Payer: Medicare Other | Admitting: Cardiology

## 2012-09-02 ENCOUNTER — Encounter: Payer: Self-pay | Admitting: Cardiology

## 2012-09-02 ENCOUNTER — Ambulatory Visit (INDEPENDENT_AMBULATORY_CARE_PROVIDER_SITE_OTHER): Payer: Medicare Other | Admitting: *Deleted

## 2012-09-02 VITALS — BP 154/78 | HR 98 | Ht 71.0 in | Wt 147.0 lb

## 2012-09-02 DIAGNOSIS — I4892 Unspecified atrial flutter: Secondary | ICD-10-CM

## 2012-09-02 DIAGNOSIS — E785 Hyperlipidemia, unspecified: Secondary | ICD-10-CM

## 2012-09-02 DIAGNOSIS — Z7901 Long term (current) use of anticoagulants: Secondary | ICD-10-CM

## 2012-09-02 DIAGNOSIS — I2510 Atherosclerotic heart disease of native coronary artery without angina pectoris: Secondary | ICD-10-CM

## 2012-09-02 DIAGNOSIS — I10 Essential (primary) hypertension: Secondary | ICD-10-CM

## 2012-09-02 NOTE — Assessment & Plan Note (Signed)
Symptomatically stable status post CABG in 2008. He reports no angina with typical ADLs and walking regimen. ECG reviewed. Continue regular medications and followup.

## 2012-09-02 NOTE — Progress Notes (Signed)
   Clinical Summary Jeremy White is an 79 y.o.male presenting for office followup. I have not seen him since 2009. Interval followup noted with Ms. Lawrence NP with diagnosis of atrial flutter in 2010. He is followed by Dr. Ouida Sills on a regular basis, also in our Coumadin clinic.  ECG today shows atrial flutter. He feels occasional palpitations. He usually walks for exercise, but has not been as regular with the cold weather.  He reports no major bleeding issues with Coumadin. Continues on the medications outlined below. He recently had a full physical with Dr. Ouida Sills. Generally he has done well.   No Known Allergies  Current Outpatient Prescriptions  Medication Sig Dispense Refill  . aspirin 325 MG EC tablet Take 325 mg by mouth 2 (two) times daily. Take 1/2 tablet two times a day.       . metFORMIN (GLUCOPHAGE) 500 MG tablet Take 500 mg by mouth 2 (two) times daily with meals.        . metoprolol (TOPROL-XL) 25 MG 24 hr tablet Take 25 mg by mouth daily.        . simvastatin (ZOCOR) 40 MG tablet Take 40 mg by mouth at bedtime.        Marland Kitchen warfarin (COUMADIN) 5 MG tablet Take 5 mg by mouth daily. As directed by Anticoagulation clinic.        No current facility-administered medications for this visit.    Past Medical History  Diagnosis Date  . Essential hypertension, benign   . Coronary atherosclerosis of native coronary artery     Multivessel, LVEF 55%  . Hyperlipidemia   . Atrial flutter   . Chronic anticoagulation 09/15/2010    Past Surgical History  Procedure Laterality Date  . Coronary artery bypass graft  05/2007    LIMA to LAD, SVG to circumflex marginal, SVG to PDA, SVG to PLB    Social History Mr. Mucci reports that he has never smoked. He has never used smokeless tobacco. Mr. Hauk reports that he does not drink alcohol.  Review of Systems Negative except as outlined.  Physical Examination Filed Vitals:   09/02/12 1433  BP: 154/78  Pulse: 98   Filed Weights   09/02/12 1433  Weight: 147 lb (66.679 kg)   No acute distress. HEENT: Conjunctiva and lids normal, oropharynx clear. Neck: Supple, no elevated JVP or carotid bruits, no thyromegaly. Lungs: Clear to auscultation, nonlabored breathing at rest. Cardiac: Irregular, no S3, no pericardial rub. Abdomen: Soft, nontender, bowel sounds present. Extremities: No pitting edema, distal pulses 2+. Skin: Warm and dry. Musculoskeletal: No kyphosis. Neuropsychiatric: Alert and oriented x3, affect grossly appropriate.   Problem List and Plan   Coronary atherosclerosis of native coronary artery Symptomatically stable status post CABG in 2008. He reports no angina with typical ADLs and walking regimen. ECG reviewed. Continue regular medications and followup.  Atrial flutter Permanent, rate typically well-controlled, came down into the 80s on my examination. Continue Toprol-XL and Coumadin.  Hyperlipidemia Keep followup with Dr. Ouida Sills. Lipids have typically been well controlled over time.  Hypertension Blood pressure elevated today, patient states this is typically not the case. Needs to keep an eye on this with Dr. Ouida Sills. ACE Inhibitor can be considered.    Jonelle Sidle, M.D., F.A.C.C.

## 2012-09-02 NOTE — Assessment & Plan Note (Signed)
Blood pressure elevated today, patient states this is typically not the case. Needs to keep an eye on this with Dr. Ouida Sills. ACE Inhibitor can be considered.

## 2012-09-02 NOTE — Assessment & Plan Note (Signed)
Keep followup with Dr. Ouida Sills. Lipids have typically been well controlled over time.

## 2012-09-02 NOTE — Patient Instructions (Addendum)
Your physician recommends that you schedule a follow-up appointment in: ONE YEAR 

## 2012-09-02 NOTE — Assessment & Plan Note (Signed)
Permanent, rate typically well-controlled, came down into the 80s on my examination. Continue Toprol-XL and Coumadin.

## 2012-10-22 ENCOUNTER — Ambulatory Visit (INDEPENDENT_AMBULATORY_CARE_PROVIDER_SITE_OTHER): Payer: Medicare Other | Admitting: *Deleted

## 2012-10-22 DIAGNOSIS — Z7901 Long term (current) use of anticoagulants: Secondary | ICD-10-CM

## 2012-10-22 DIAGNOSIS — I4892 Unspecified atrial flutter: Secondary | ICD-10-CM

## 2012-12-03 ENCOUNTER — Ambulatory Visit (INDEPENDENT_AMBULATORY_CARE_PROVIDER_SITE_OTHER): Payer: Medicare Other | Admitting: *Deleted

## 2012-12-03 DIAGNOSIS — Z7901 Long term (current) use of anticoagulants: Secondary | ICD-10-CM

## 2012-12-03 DIAGNOSIS — I4892 Unspecified atrial flutter: Secondary | ICD-10-CM

## 2012-12-28 ENCOUNTER — Ambulatory Visit (INDEPENDENT_AMBULATORY_CARE_PROVIDER_SITE_OTHER): Payer: Medicare Other | Admitting: *Deleted

## 2012-12-28 DIAGNOSIS — I4892 Unspecified atrial flutter: Secondary | ICD-10-CM

## 2012-12-28 DIAGNOSIS — Z7901 Long term (current) use of anticoagulants: Secondary | ICD-10-CM

## 2012-12-28 LAB — POCT INR: INR: 3.5

## 2013-01-21 ENCOUNTER — Ambulatory Visit (INDEPENDENT_AMBULATORY_CARE_PROVIDER_SITE_OTHER): Payer: Medicare Other | Admitting: *Deleted

## 2013-01-21 DIAGNOSIS — I4892 Unspecified atrial flutter: Secondary | ICD-10-CM

## 2013-01-21 DIAGNOSIS — Z7901 Long term (current) use of anticoagulants: Secondary | ICD-10-CM

## 2013-02-18 ENCOUNTER — Ambulatory Visit (INDEPENDENT_AMBULATORY_CARE_PROVIDER_SITE_OTHER): Payer: Medicare Other | Admitting: *Deleted

## 2013-02-18 DIAGNOSIS — I4892 Unspecified atrial flutter: Secondary | ICD-10-CM

## 2013-02-18 DIAGNOSIS — Z7901 Long term (current) use of anticoagulants: Secondary | ICD-10-CM

## 2013-03-18 ENCOUNTER — Ambulatory Visit (INDEPENDENT_AMBULATORY_CARE_PROVIDER_SITE_OTHER): Payer: Medicare Other | Admitting: *Deleted

## 2013-03-18 DIAGNOSIS — Z7901 Long term (current) use of anticoagulants: Secondary | ICD-10-CM

## 2013-03-18 DIAGNOSIS — I4892 Unspecified atrial flutter: Secondary | ICD-10-CM

## 2013-04-15 ENCOUNTER — Ambulatory Visit (INDEPENDENT_AMBULATORY_CARE_PROVIDER_SITE_OTHER): Payer: Medicare Other | Admitting: *Deleted

## 2013-04-15 DIAGNOSIS — I4892 Unspecified atrial flutter: Secondary | ICD-10-CM

## 2013-04-15 DIAGNOSIS — Z7901 Long term (current) use of anticoagulants: Secondary | ICD-10-CM

## 2013-05-13 ENCOUNTER — Ambulatory Visit (INDEPENDENT_AMBULATORY_CARE_PROVIDER_SITE_OTHER): Payer: Medicare Other | Admitting: *Deleted

## 2013-05-13 DIAGNOSIS — I4892 Unspecified atrial flutter: Secondary | ICD-10-CM

## 2013-05-13 DIAGNOSIS — Z7901 Long term (current) use of anticoagulants: Secondary | ICD-10-CM

## 2013-06-28 ENCOUNTER — Ambulatory Visit (INDEPENDENT_AMBULATORY_CARE_PROVIDER_SITE_OTHER): Payer: Medicare Other | Admitting: *Deleted

## 2013-06-28 DIAGNOSIS — I4892 Unspecified atrial flutter: Secondary | ICD-10-CM

## 2013-06-28 DIAGNOSIS — Z7901 Long term (current) use of anticoagulants: Secondary | ICD-10-CM

## 2013-06-28 LAB — POCT INR: INR: 2.1

## 2013-08-09 ENCOUNTER — Ambulatory Visit (INDEPENDENT_AMBULATORY_CARE_PROVIDER_SITE_OTHER): Payer: Medicare Other | Admitting: *Deleted

## 2013-08-09 DIAGNOSIS — Z5181 Encounter for therapeutic drug level monitoring: Secondary | ICD-10-CM

## 2013-08-09 DIAGNOSIS — I4892 Unspecified atrial flutter: Secondary | ICD-10-CM

## 2013-08-09 DIAGNOSIS — Z7901 Long term (current) use of anticoagulants: Secondary | ICD-10-CM

## 2013-08-09 LAB — POCT INR: INR: 2.7

## 2013-09-20 ENCOUNTER — Ambulatory Visit (INDEPENDENT_AMBULATORY_CARE_PROVIDER_SITE_OTHER): Payer: Medicare Other | Admitting: *Deleted

## 2013-09-20 DIAGNOSIS — I4892 Unspecified atrial flutter: Secondary | ICD-10-CM

## 2013-09-20 DIAGNOSIS — Z5181 Encounter for therapeutic drug level monitoring: Secondary | ICD-10-CM

## 2013-09-20 DIAGNOSIS — Z7901 Long term (current) use of anticoagulants: Secondary | ICD-10-CM

## 2013-09-20 LAB — POCT INR: INR: 2.5

## 2013-11-01 ENCOUNTER — Ambulatory Visit (INDEPENDENT_AMBULATORY_CARE_PROVIDER_SITE_OTHER): Payer: Medicare Other | Admitting: *Deleted

## 2013-11-01 DIAGNOSIS — I4892 Unspecified atrial flutter: Secondary | ICD-10-CM

## 2013-11-01 DIAGNOSIS — Z7901 Long term (current) use of anticoagulants: Secondary | ICD-10-CM

## 2013-11-01 DIAGNOSIS — Z5181 Encounter for therapeutic drug level monitoring: Secondary | ICD-10-CM

## 2013-11-01 LAB — POCT INR: INR: 2

## 2013-12-13 ENCOUNTER — Ambulatory Visit (INDEPENDENT_AMBULATORY_CARE_PROVIDER_SITE_OTHER): Payer: Medicare Other | Admitting: *Deleted

## 2013-12-13 DIAGNOSIS — Z5181 Encounter for therapeutic drug level monitoring: Secondary | ICD-10-CM

## 2013-12-13 DIAGNOSIS — I4892 Unspecified atrial flutter: Secondary | ICD-10-CM

## 2013-12-13 DIAGNOSIS — Z7901 Long term (current) use of anticoagulants: Secondary | ICD-10-CM

## 2013-12-13 LAB — POCT INR: INR: 3

## 2014-01-24 ENCOUNTER — Ambulatory Visit (INDEPENDENT_AMBULATORY_CARE_PROVIDER_SITE_OTHER): Payer: Medicare Other | Admitting: *Deleted

## 2014-01-24 DIAGNOSIS — I4892 Unspecified atrial flutter: Secondary | ICD-10-CM

## 2014-01-24 DIAGNOSIS — Z7901 Long term (current) use of anticoagulants: Secondary | ICD-10-CM

## 2014-01-24 DIAGNOSIS — Z5181 Encounter for therapeutic drug level monitoring: Secondary | ICD-10-CM

## 2014-01-24 LAB — POCT INR: INR: 2.4

## 2014-03-10 ENCOUNTER — Ambulatory Visit (INDEPENDENT_AMBULATORY_CARE_PROVIDER_SITE_OTHER): Payer: Medicare Other | Admitting: *Deleted

## 2014-03-10 DIAGNOSIS — I4892 Unspecified atrial flutter: Secondary | ICD-10-CM

## 2014-03-10 DIAGNOSIS — Z5181 Encounter for therapeutic drug level monitoring: Secondary | ICD-10-CM

## 2014-03-10 DIAGNOSIS — Z7901 Long term (current) use of anticoagulants: Secondary | ICD-10-CM

## 2014-03-10 LAB — POCT INR: INR: 3

## 2014-04-18 ENCOUNTER — Ambulatory Visit (INDEPENDENT_AMBULATORY_CARE_PROVIDER_SITE_OTHER): Payer: Medicare Other | Admitting: *Deleted

## 2014-04-18 DIAGNOSIS — Z7901 Long term (current) use of anticoagulants: Secondary | ICD-10-CM

## 2014-04-18 DIAGNOSIS — I4892 Unspecified atrial flutter: Secondary | ICD-10-CM

## 2014-04-18 DIAGNOSIS — Z5181 Encounter for therapeutic drug level monitoring: Secondary | ICD-10-CM

## 2014-04-18 LAB — POCT INR: INR: 3.7

## 2014-05-16 ENCOUNTER — Ambulatory Visit (INDEPENDENT_AMBULATORY_CARE_PROVIDER_SITE_OTHER): Payer: Medicare Other | Admitting: *Deleted

## 2014-05-16 DIAGNOSIS — I4892 Unspecified atrial flutter: Secondary | ICD-10-CM

## 2014-05-16 DIAGNOSIS — Z5181 Encounter for therapeutic drug level monitoring: Secondary | ICD-10-CM

## 2014-05-16 DIAGNOSIS — Z7901 Long term (current) use of anticoagulants: Secondary | ICD-10-CM

## 2014-05-16 LAB — POCT INR: INR: 2.7

## 2014-06-13 ENCOUNTER — Ambulatory Visit (INDEPENDENT_AMBULATORY_CARE_PROVIDER_SITE_OTHER): Payer: Medicare Other | Admitting: *Deleted

## 2014-06-13 DIAGNOSIS — Z7901 Long term (current) use of anticoagulants: Secondary | ICD-10-CM

## 2014-06-13 DIAGNOSIS — I4892 Unspecified atrial flutter: Secondary | ICD-10-CM

## 2014-06-13 DIAGNOSIS — Z5181 Encounter for therapeutic drug level monitoring: Secondary | ICD-10-CM

## 2014-06-13 LAB — POCT INR: INR: 2.5

## 2014-07-06 ENCOUNTER — Ambulatory Visit: Payer: Medicare Other | Admitting: Cardiovascular Disease

## 2014-07-07 DIAGNOSIS — E785 Hyperlipidemia, unspecified: Secondary | ICD-10-CM | POA: Diagnosis not present

## 2014-07-07 DIAGNOSIS — I2510 Atherosclerotic heart disease of native coronary artery without angina pectoris: Secondary | ICD-10-CM | POA: Diagnosis not present

## 2014-07-07 DIAGNOSIS — I4892 Unspecified atrial flutter: Secondary | ICD-10-CM | POA: Diagnosis not present

## 2014-07-07 DIAGNOSIS — E119 Type 2 diabetes mellitus without complications: Secondary | ICD-10-CM | POA: Diagnosis not present

## 2014-07-07 DIAGNOSIS — Z79899 Other long term (current) drug therapy: Secondary | ICD-10-CM | POA: Diagnosis not present

## 2014-07-14 DIAGNOSIS — E119 Type 2 diabetes mellitus without complications: Secondary | ICD-10-CM | POA: Diagnosis not present

## 2014-07-14 DIAGNOSIS — Z0001 Encounter for general adult medical examination with abnormal findings: Secondary | ICD-10-CM | POA: Diagnosis not present

## 2014-07-14 DIAGNOSIS — I4892 Unspecified atrial flutter: Secondary | ICD-10-CM | POA: Diagnosis not present

## 2014-07-14 DIAGNOSIS — E785 Hyperlipidemia, unspecified: Secondary | ICD-10-CM | POA: Diagnosis not present

## 2014-07-14 DIAGNOSIS — Z23 Encounter for immunization: Secondary | ICD-10-CM | POA: Diagnosis not present

## 2014-07-18 ENCOUNTER — Ambulatory Visit (INDEPENDENT_AMBULATORY_CARE_PROVIDER_SITE_OTHER): Payer: Medicare Other | Admitting: *Deleted

## 2014-07-18 ENCOUNTER — Ambulatory Visit: Payer: Self-pay | Admitting: Cardiovascular Disease

## 2014-07-18 DIAGNOSIS — I4892 Unspecified atrial flutter: Secondary | ICD-10-CM

## 2014-07-18 DIAGNOSIS — Z7901 Long term (current) use of anticoagulants: Secondary | ICD-10-CM | POA: Diagnosis not present

## 2014-07-18 DIAGNOSIS — Z5181 Encounter for therapeutic drug level monitoring: Secondary | ICD-10-CM

## 2014-07-18 LAB — POCT INR: INR: 2.5

## 2014-07-29 ENCOUNTER — Ambulatory Visit: Payer: Self-pay | Admitting: Cardiovascular Disease

## 2014-08-29 ENCOUNTER — Ambulatory Visit (INDEPENDENT_AMBULATORY_CARE_PROVIDER_SITE_OTHER): Payer: Medicare Other | Admitting: *Deleted

## 2014-08-29 ENCOUNTER — Encounter: Payer: Self-pay | Admitting: Cardiology

## 2014-08-29 ENCOUNTER — Ambulatory Visit (INDEPENDENT_AMBULATORY_CARE_PROVIDER_SITE_OTHER): Payer: Medicare Other | Admitting: Cardiology

## 2014-08-29 VITALS — BP 144/72 | HR 61 | Ht 71.0 in | Wt 144.8 lb

## 2014-08-29 DIAGNOSIS — I4892 Unspecified atrial flutter: Secondary | ICD-10-CM | POA: Diagnosis not present

## 2014-08-29 DIAGNOSIS — Z5181 Encounter for therapeutic drug level monitoring: Secondary | ICD-10-CM | POA: Diagnosis not present

## 2014-08-29 DIAGNOSIS — Z7901 Long term (current) use of anticoagulants: Secondary | ICD-10-CM | POA: Diagnosis not present

## 2014-08-29 DIAGNOSIS — I483 Typical atrial flutter: Secondary | ICD-10-CM

## 2014-08-29 DIAGNOSIS — I2510 Atherosclerotic heart disease of native coronary artery without angina pectoris: Secondary | ICD-10-CM

## 2014-08-29 LAB — POCT INR: INR: 1.9

## 2014-08-29 NOTE — Patient Instructions (Signed)

## 2014-08-29 NOTE — Progress Notes (Signed)
Cardiology Office Note  Date: 08/29/2014   ID: Jeremy White, DOB [birth year], MRN [id number]  PCP: Carylon Perches, MD  Primary Cardiologist: Nona Dell, MD   Chief Complaint  Patient presents with  . Coronary Artery Disease  . Atrial Flutter  . Hypertension    History of Present Illness: Jeremy White is an 81 y.o. male last seen in March 2014. He presents for a follow-up visit today, continues to see Dr. Ouida Sills twice a year. He remains very active, enjoys playing golf. He denies any regular sense of palpitations, has had no exertional chest pain, or increasing shortness of breath beyond NYHA class II.   He continues to follow in the anticoagulation clinic. No reported spontaneous bleeding problems on Coumadin.  He has not had any interval ischemic testing, prefers to hold off on this type of evaluation in the absence of symptoms. We reviewed his medical therapy which is outlined below.   Past Medical History  Diagnosis Date  . Essential hypertension, benign   . Coronary atherosclerosis of native coronary artery     Multivessel, LVEF 55%  . Hyperlipidemia   . Atrial flutter   . Chronic anticoagulation 09/15/2010    Past Surgical History  Procedure Laterality Date  . Coronary artery bypass graft  05/2007    LIMA to LAD, SVG to circumflex marginal, SVG to PDA, SVG to PLB    Current Outpatient Prescriptions  Medication Sig Dispense Refill  . aspirin 81 MG chewable tablet Chew 81 mg by mouth daily.    . metFORMIN (GLUCOPHAGE) 500 MG tablet Take 500 mg by mouth 2 (two) times daily with meals.      . metoprolol (TOPROL-XL) 25 MG 24 hr tablet Take 25 mg by mouth daily.      . simvastatin (ZOCOR) 40 MG tablet Take 40 mg by mouth at bedtime.      Marland Kitchen warfarin (COUMADIN) 5 MG tablet Take 5 mg by mouth daily. As directed by Anticoagulation clinic.      No current facility-administered medications for this visit.    Allergies:  Review of patient's allergies indicates no known  allergies.   Social History: The patient  reports that he has never smoked. He has never used smokeless tobacco. He reports that he does not drink alcohol or use illicit drugs.   ROS:  Please see the history of present illness. Otherwise, complete review of systems is positive for being hard of hearing.  All other systems are reviewed and negative.    Physical Exam: VS:  BP 144/72 mmHg  Pulse 61  Ht  (1.803 m)  Wt 144 lb 12.8 oz (65.681 kg)  BMI 20.20 kg/m2, BMI Body mass index is 20.2 kg/(m^2).  Wt Readings from Last 3 Encounters:  08/29/14 144 lb 12.8 oz (65.681 kg)  09/02/12 147 lb (66.679 kg)  01/04/10 153 lb (69.4 kg)    No acute distress. HEENT: Conjunctiva and lids normal, oropharynx clear. Neck: Supple, no elevated JVP or carotid bruits, no thyromegaly. Lungs: Clear to auscultation, nonlabored breathing at rest. Cardiac: Irregular, no S3, no pericardial rub. Abdomen: Soft, nontender, bowel sounds present. Extremities: No pitting edema, distal pulses 2+. Skin: Warm and dry. Musculoskeletal: No kyphosis. Neuropsychiatric: Alert and oriented x3, affect grossly appropriate.   ECG: ECG is ordered today and reviewed finding rate-controlled typical atrial flutter with 4:1 block.  Other Studies Reviewed Today:  Echocardiogram 06/05/2009: Study Conclusions  1. Left ventricle: Inferobasal hypokinesis  The cavity size was mildly dilated.  Wall thickness was increased  in a pattern of moderate LVH. Systolic function was normal. The  estimated ejection fraction was in the range of 50% to 55%. 2. Aortic valve: Trivial regurgitation. 3. Left atrium: The atrium was mildly dilated. 4. Right atrium: The atrium was mildly dilated. 5. Atrial septum: No defect or patent foramen ovale was identified.   ASSESSMENT AND PLAN:  1. Asymptomatic atrial flutter with controlled ventricular response on medical therapy. Patient continues on stroke prophylaxis with Coumadin  and is followed in the anticoagulation clinic.  2. CAD status post CABG in 2008, no active angina symptoms. Patient has preferred conservative follow-up without ischemic testing over time.  Current medicines are reviewed at length with the patient today.  The patient does not have concerns regarding medicines.   Orders Placed This Encounter  Procedures  . EKG 12-Lead    Disposition: FU with me in 1 year.   Signed, Jonelle SidleSamuel G. McDowell, MD, [facility]ACC 08/29/2014 11:19 AM    [facility] [address], [place], [place] [address] Phone: [phone]; Fax: [phone]

## 2014-10-10 ENCOUNTER — Ambulatory Visit (INDEPENDENT_AMBULATORY_CARE_PROVIDER_SITE_OTHER): Payer: Medicare Other | Admitting: *Deleted

## 2014-10-10 DIAGNOSIS — I4892 Unspecified atrial flutter: Secondary | ICD-10-CM

## 2014-10-10 DIAGNOSIS — Z7901 Long term (current) use of anticoagulants: Secondary | ICD-10-CM | POA: Diagnosis not present

## 2014-10-10 DIAGNOSIS — Z5181 Encounter for therapeutic drug level monitoring: Secondary | ICD-10-CM | POA: Diagnosis not present

## 2014-10-10 LAB — POCT INR: INR: 1.5

## 2014-10-24 ENCOUNTER — Ambulatory Visit (INDEPENDENT_AMBULATORY_CARE_PROVIDER_SITE_OTHER): Payer: Medicare Other | Admitting: *Deleted

## 2014-10-24 DIAGNOSIS — Z5181 Encounter for therapeutic drug level monitoring: Secondary | ICD-10-CM

## 2014-10-24 DIAGNOSIS — Z7901 Long term (current) use of anticoagulants: Secondary | ICD-10-CM

## 2014-10-24 DIAGNOSIS — I4892 Unspecified atrial flutter: Secondary | ICD-10-CM | POA: Diagnosis not present

## 2014-10-24 DIAGNOSIS — I483 Typical atrial flutter: Secondary | ICD-10-CM | POA: Diagnosis not present

## 2014-10-24 LAB — POCT INR: INR: 2.5

## 2014-11-21 ENCOUNTER — Ambulatory Visit (INDEPENDENT_AMBULATORY_CARE_PROVIDER_SITE_OTHER): Payer: Medicare Other | Admitting: *Deleted

## 2014-11-21 DIAGNOSIS — Z5181 Encounter for therapeutic drug level monitoring: Secondary | ICD-10-CM | POA: Diagnosis not present

## 2014-11-21 DIAGNOSIS — I4892 Unspecified atrial flutter: Secondary | ICD-10-CM

## 2014-11-21 DIAGNOSIS — Z7901 Long term (current) use of anticoagulants: Secondary | ICD-10-CM

## 2014-11-21 LAB — POCT INR: INR: 3.2

## 2014-12-19 ENCOUNTER — Ambulatory Visit (INDEPENDENT_AMBULATORY_CARE_PROVIDER_SITE_OTHER): Payer: Medicare Other | Admitting: *Deleted

## 2014-12-19 DIAGNOSIS — Z7901 Long term (current) use of anticoagulants: Secondary | ICD-10-CM | POA: Diagnosis not present

## 2014-12-19 DIAGNOSIS — Z5181 Encounter for therapeutic drug level monitoring: Secondary | ICD-10-CM

## 2014-12-19 DIAGNOSIS — I4892 Unspecified atrial flutter: Secondary | ICD-10-CM | POA: Diagnosis not present

## 2014-12-19 LAB — POCT INR: INR: 3

## 2015-01-09 DIAGNOSIS — E119 Type 2 diabetes mellitus without complications: Secondary | ICD-10-CM | POA: Diagnosis not present

## 2015-01-16 ENCOUNTER — Ambulatory Visit (INDEPENDENT_AMBULATORY_CARE_PROVIDER_SITE_OTHER): Payer: Medicare Other | Admitting: *Deleted

## 2015-01-16 DIAGNOSIS — I4892 Unspecified atrial flutter: Secondary | ICD-10-CM

## 2015-01-16 DIAGNOSIS — Z5181 Encounter for therapeutic drug level monitoring: Secondary | ICD-10-CM | POA: Diagnosis not present

## 2015-01-16 DIAGNOSIS — Z7901 Long term (current) use of anticoagulants: Secondary | ICD-10-CM | POA: Diagnosis not present

## 2015-01-16 LAB — POCT INR: INR: 2.8

## 2015-01-19 DIAGNOSIS — Z6822 Body mass index (BMI) 22.0-22.9, adult: Secondary | ICD-10-CM | POA: Diagnosis not present

## 2015-01-19 DIAGNOSIS — Z23 Encounter for immunization: Secondary | ICD-10-CM | POA: Diagnosis not present

## 2015-01-19 DIAGNOSIS — I2510 Atherosclerotic heart disease of native coronary artery without angina pectoris: Secondary | ICD-10-CM | POA: Diagnosis not present

## 2015-01-19 DIAGNOSIS — E119 Type 2 diabetes mellitus without complications: Secondary | ICD-10-CM | POA: Diagnosis not present

## 2015-02-13 ENCOUNTER — Ambulatory Visit (INDEPENDENT_AMBULATORY_CARE_PROVIDER_SITE_OTHER): Payer: Medicare Other | Admitting: *Deleted

## 2015-02-13 DIAGNOSIS — Z7901 Long term (current) use of anticoagulants: Secondary | ICD-10-CM

## 2015-02-13 DIAGNOSIS — Z5181 Encounter for therapeutic drug level monitoring: Secondary | ICD-10-CM | POA: Diagnosis not present

## 2015-02-13 DIAGNOSIS — I4892 Unspecified atrial flutter: Secondary | ICD-10-CM | POA: Diagnosis not present

## 2015-02-13 LAB — POCT INR: INR: 3.2

## 2015-03-13 ENCOUNTER — Ambulatory Visit (INDEPENDENT_AMBULATORY_CARE_PROVIDER_SITE_OTHER): Payer: Medicare Other | Admitting: *Deleted

## 2015-03-13 ENCOUNTER — Telehealth: Payer: Self-pay | Admitting: Cardiology

## 2015-03-13 DIAGNOSIS — Z5181 Encounter for therapeutic drug level monitoring: Secondary | ICD-10-CM | POA: Diagnosis not present

## 2015-03-13 DIAGNOSIS — I4892 Unspecified atrial flutter: Secondary | ICD-10-CM | POA: Diagnosis not present

## 2015-03-13 DIAGNOSIS — Z7901 Long term (current) use of anticoagulants: Secondary | ICD-10-CM | POA: Diagnosis not present

## 2015-03-13 LAB — POCT INR: INR: 2.8

## 2015-03-13 NOTE — Telephone Encounter (Signed)
[facility] will not scheduled the dental procedure until they get directions from cardiology for how to the patient should handle his coumadin pre-operatively.  Please call Aundra Millet at the Dental Office @ [phone] with instructions.

## 2015-03-15 NOTE — Telephone Encounter (Signed)
Spoke with Aundra Millet at Dr Theodoro Kos office.  Pt needs to have 1 dental extraction.  Ok for pt to hold coumadin 3 days prior to procedure.  He will resume coumadinnight of procedure if OK with Dr Colin Benton.

## 2015-04-10 ENCOUNTER — Ambulatory Visit (INDEPENDENT_AMBULATORY_CARE_PROVIDER_SITE_OTHER): Payer: Medicare Other | Admitting: *Deleted

## 2015-04-10 DIAGNOSIS — Z5181 Encounter for therapeutic drug level monitoring: Secondary | ICD-10-CM

## 2015-04-10 DIAGNOSIS — I4892 Unspecified atrial flutter: Secondary | ICD-10-CM

## 2015-04-10 DIAGNOSIS — Z7901 Long term (current) use of anticoagulants: Secondary | ICD-10-CM

## 2015-04-10 LAB — POCT INR: INR: 1.2

## 2015-04-24 ENCOUNTER — Ambulatory Visit (INDEPENDENT_AMBULATORY_CARE_PROVIDER_SITE_OTHER): Payer: Medicare Other | Admitting: *Deleted

## 2015-04-24 DIAGNOSIS — Z5181 Encounter for therapeutic drug level monitoring: Secondary | ICD-10-CM

## 2015-04-24 DIAGNOSIS — Z7901 Long term (current) use of anticoagulants: Secondary | ICD-10-CM

## 2015-04-24 DIAGNOSIS — I4892 Unspecified atrial flutter: Secondary | ICD-10-CM | POA: Diagnosis not present

## 2015-04-24 LAB — POCT INR: INR: 2

## 2015-05-22 ENCOUNTER — Ambulatory Visit (INDEPENDENT_AMBULATORY_CARE_PROVIDER_SITE_OTHER): Payer: Medicare Other | Admitting: *Deleted

## 2015-05-22 DIAGNOSIS — I4892 Unspecified atrial flutter: Secondary | ICD-10-CM

## 2015-05-22 DIAGNOSIS — Z5181 Encounter for therapeutic drug level monitoring: Secondary | ICD-10-CM | POA: Diagnosis not present

## 2015-05-22 DIAGNOSIS — Z7901 Long term (current) use of anticoagulants: Secondary | ICD-10-CM

## 2015-05-22 LAB — POCT INR: INR: 3

## 2015-06-29 ENCOUNTER — Ambulatory Visit (INDEPENDENT_AMBULATORY_CARE_PROVIDER_SITE_OTHER): Payer: Medicare Other | Admitting: *Deleted

## 2015-06-29 DIAGNOSIS — I4892 Unspecified atrial flutter: Secondary | ICD-10-CM

## 2015-06-29 DIAGNOSIS — Z7901 Long term (current) use of anticoagulants: Secondary | ICD-10-CM

## 2015-06-29 DIAGNOSIS — Z5181 Encounter for therapeutic drug level monitoring: Secondary | ICD-10-CM

## 2015-06-29 DIAGNOSIS — M7981 Nontraumatic hematoma of soft tissue: Secondary | ICD-10-CM | POA: Diagnosis not present

## 2015-06-29 LAB — POCT INR: INR: 2.4

## 2015-07-05 ENCOUNTER — Ambulatory Visit (INDEPENDENT_AMBULATORY_CARE_PROVIDER_SITE_OTHER): Payer: Medicare Other | Admitting: *Deleted

## 2015-07-05 DIAGNOSIS — Z5181 Encounter for therapeutic drug level monitoring: Secondary | ICD-10-CM | POA: Diagnosis not present

## 2015-07-05 DIAGNOSIS — I4892 Unspecified atrial flutter: Secondary | ICD-10-CM | POA: Diagnosis not present

## 2015-07-05 DIAGNOSIS — Z7901 Long term (current) use of anticoagulants: Secondary | ICD-10-CM | POA: Diagnosis not present

## 2015-07-05 LAB — POCT INR: INR: 2.8

## 2015-07-14 DIAGNOSIS — E119 Type 2 diabetes mellitus without complications: Secondary | ICD-10-CM | POA: Diagnosis not present

## 2015-07-14 DIAGNOSIS — E785 Hyperlipidemia, unspecified: Secondary | ICD-10-CM | POA: Diagnosis not present

## 2015-07-14 DIAGNOSIS — Z79899 Other long term (current) drug therapy: Secondary | ICD-10-CM | POA: Diagnosis not present

## 2015-07-14 DIAGNOSIS — I2510 Atherosclerotic heart disease of native coronary artery without angina pectoris: Secondary | ICD-10-CM | POA: Diagnosis not present

## 2015-07-20 DIAGNOSIS — Z0001 Encounter for general adult medical examination with abnormal findings: Secondary | ICD-10-CM | POA: Diagnosis not present

## 2015-07-20 DIAGNOSIS — I2510 Atherosclerotic heart disease of native coronary artery without angina pectoris: Secondary | ICD-10-CM | POA: Diagnosis not present

## 2015-07-20 DIAGNOSIS — Z23 Encounter for immunization: Secondary | ICD-10-CM | POA: Diagnosis not present

## 2015-07-20 DIAGNOSIS — E119 Type 2 diabetes mellitus without complications: Secondary | ICD-10-CM | POA: Diagnosis not present

## 2015-07-20 DIAGNOSIS — E785 Hyperlipidemia, unspecified: Secondary | ICD-10-CM | POA: Diagnosis not present

## 2015-08-16 ENCOUNTER — Ambulatory Visit (INDEPENDENT_AMBULATORY_CARE_PROVIDER_SITE_OTHER): Payer: Medicare Other | Admitting: *Deleted

## 2015-08-16 DIAGNOSIS — I4892 Unspecified atrial flutter: Secondary | ICD-10-CM

## 2015-08-16 DIAGNOSIS — Z7901 Long term (current) use of anticoagulants: Secondary | ICD-10-CM | POA: Diagnosis not present

## 2015-08-16 DIAGNOSIS — Z5181 Encounter for therapeutic drug level monitoring: Secondary | ICD-10-CM

## 2015-08-16 LAB — POCT INR: INR: 3.4

## 2015-09-13 ENCOUNTER — Ambulatory Visit (INDEPENDENT_AMBULATORY_CARE_PROVIDER_SITE_OTHER): Payer: Medicare Other | Admitting: *Deleted

## 2015-09-13 DIAGNOSIS — I4892 Unspecified atrial flutter: Secondary | ICD-10-CM

## 2015-09-13 DIAGNOSIS — Z7901 Long term (current) use of anticoagulants: Secondary | ICD-10-CM | POA: Diagnosis not present

## 2015-09-13 DIAGNOSIS — Z5181 Encounter for therapeutic drug level monitoring: Secondary | ICD-10-CM

## 2015-09-13 LAB — POCT INR: INR: 2.5

## 2015-09-18 DIAGNOSIS — N400 Enlarged prostate without lower urinary tract symptoms: Secondary | ICD-10-CM | POA: Diagnosis not present

## 2015-10-11 ENCOUNTER — Ambulatory Visit (INDEPENDENT_AMBULATORY_CARE_PROVIDER_SITE_OTHER): Payer: Medicare Other | Admitting: *Deleted

## 2015-10-11 DIAGNOSIS — Z7901 Long term (current) use of anticoagulants: Secondary | ICD-10-CM

## 2015-10-11 DIAGNOSIS — Z5181 Encounter for therapeutic drug level monitoring: Secondary | ICD-10-CM | POA: Diagnosis not present

## 2015-10-11 DIAGNOSIS — I4892 Unspecified atrial flutter: Secondary | ICD-10-CM

## 2015-10-11 LAB — POCT INR: INR: 2.7

## 2015-11-05 DIAGNOSIS — I469 Cardiac arrest, cause unspecified: Secondary | ICD-10-CM | POA: Diagnosis not present

## 2015-11-09 ENCOUNTER — Ambulatory Visit: Payer: Self-pay | Admitting: *Deleted

## 2015-11-09 DIAGNOSIS — Z5181 Encounter for therapeutic drug level monitoring: Secondary | ICD-10-CM

## 2015-11-09 DIAGNOSIS — I4892 Unspecified atrial flutter: Secondary | ICD-10-CM

## 2015-11-30 DIAGNOSIS — 419620001 Death: Secondary | SNOMED CT | POA: Diagnosis not present

## 2015-11-30 DEATH — deceased
